# Patient Record
Sex: Female | Born: 1965
Health system: Southern US, Community
[De-identification: ages and names within clinical notes are randomized; demographics above are authoritative.]

## PROBLEM LIST (undated history)

## (undated) HISTORY — PX: DILATION AND CURETTAGE OF UTERUS: SHX78

---
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
## 2013-12-18 ENCOUNTER — Encounter: Payer: Self-pay | Admitting: Women's Health

## 2013-12-18 ENCOUNTER — Other Ambulatory Visit (HOSPITAL_COMMUNITY)
Admission: RE | Admit: 2013-12-18 | Discharge: 2013-12-18 | Disposition: A | Discharge status: Home / Self Care | Payer: BC Managed Care – PPO | Source: Ambulatory Visit | Attending: Gynecology | Admitting: Gynecology

## 2013-12-18 ENCOUNTER — Ambulatory Visit (INDEPENDENT_AMBULATORY_CARE_PROVIDER_SITE_OTHER): Payer: BC Managed Care – PPO | Admitting: Women's Health

## 2013-12-18 VITALS — BP 110/68 | Ht 62.0 in | Wt 139.2 lb

## 2013-12-18 DIAGNOSIS — N951 Menopausal and female climacteric states: Secondary | ICD-10-CM

## 2013-12-18 DIAGNOSIS — E079 Disorder of thyroid, unspecified: Secondary | ICD-10-CM

## 2013-12-18 DIAGNOSIS — Z01419 Encounter for gynecological examination (general) (routine) without abnormal findings: Secondary | ICD-10-CM | POA: Insufficient documentation

## 2013-12-18 DIAGNOSIS — Z13220 Encounter for screening for lipoid disorders: Secondary | ICD-10-CM

## 2013-12-18 LAB — COMPREHENSIVE METABOLIC PANEL
ALT: 23 U/L (ref 0–35)
AST: 18 U/L (ref 0–37)
Albumin: 4.4 g/dL (ref 3.5–5.2)
Alkaline Phosphatase: 52 U/L (ref 39–117)
BUN: 11 mg/dL (ref 6–23)
CALCIUM: 9.6 mg/dL (ref 8.4–10.5)
CHLORIDE: 101 meq/L (ref 96–112)
CO2: 31 meq/L (ref 19–32)
Creat: 0.56 mg/dL (ref 0.50–1.10)
GLUCOSE: 82 mg/dL (ref 70–99)
Potassium: 4.3 mEq/L (ref 3.5–5.3)
Sodium: 136 mEq/L (ref 135–145)
Total Bilirubin: 0.6 mg/dL (ref 0.2–1.2)
Total Protein: 6.9 g/dL (ref 6.0–8.3)

## 2013-12-18 LAB — CBC WITH DIFFERENTIAL/PLATELET
Basophils Absolute: 0 10*3/uL (ref 0.0–0.1)
Basophils Relative: 0 % (ref 0–1)
EOS PCT: 1 % (ref 0–5)
Eosinophils Absolute: 0.1 10*3/uL (ref 0.0–0.7)
HEMATOCRIT: 43.8 % (ref 36.0–46.0)
Hemoglobin: 15.1 g/dL — ABNORMAL HIGH (ref 12.0–15.0)
LYMPHS ABS: 1.9 10*3/uL (ref 0.7–4.0)
Lymphocytes Relative: 24 % (ref 12–46)
MCH: 30.6 pg (ref 26.0–34.0)
MCHC: 34.5 g/dL (ref 30.0–36.0)
MCV: 88.7 fL (ref 78.0–100.0)
MONO ABS: 0.3 10*3/uL (ref 0.1–1.0)
MONOS PCT: 4 % (ref 3–12)
Neutro Abs: 5.5 10*3/uL (ref 1.7–7.7)
Neutrophils Relative %: 71 % (ref 43–77)
Platelets: 353 10*3/uL (ref 150–400)
RBC: 4.94 MIL/uL (ref 3.87–5.11)
RDW: 13.3 % (ref 11.5–15.5)
WBC: 7.8 10*3/uL (ref 4.0–10.5)

## 2013-12-18 LAB — LIPID PANEL
CHOL/HDL RATIO: 2.8 ratio
CHOLESTEROL: 175 mg/dL (ref 0–200)
HDL: 63 mg/dL (ref >39)
LDL Cholesterol: 98 mg/dL (ref 0–99)
Triglycerides: 69 mg/dL (ref <150)
VLDL: 14 mg/dL (ref 0–40)

## 2013-12-18 LAB — TSH: TSH: 1.326 u[IU]/mL (ref 0.350–4.500)

## 2013-12-18 MED ORDER — ESTRADIOL 0.05 MG/24HR TD PTTW: 1.0000 | MEDICATED_PATCH | TRANSDERMAL | Status: DC

## 2013-12-18 NOTE — Progress Notes (Signed)
Susan Hensley March 26, 1966 [id number]    History:    Presents for annual exam with complaints of hot flushes, mood swings, and stress and urge incontinence wearing a pad daily. Mirena IUD placed May 2013 , irregular light bleeding. History of irregular cycles and endometrial hyperplasia with D&C March 2013, Mirena placed after. Last pap normal April 2014. Last 3D mammogram normal December 2013.   Past medical history, past surgical history, family history and social history were all reviewed and documented in the EPIC chart. Recent move from [place]. Works from home. Married with  twins, boy and girl, 48 years old. Husband is retired IT sales professionalfirefighter.   ROS:  A  ROS was performed and pertinent positives and negatives are included.  Exam:  Filed Vitals:   12/18/13 1111  BP: 110/68    General appearance:  Normal, appears well Thyroid:  Symmetrical, normal in size, without palpable masses or nodularity. Respiratory  Auscultation:  Clear without wheezing or rhonchi Cardiovascular  Auscultation:  Regular rate, without rubs, murmurs or gallops  Edema/varicosities:  Not grossly evident Abdominal  Soft,nontender, without masses, guarding or rebound.  Liver/spleen:  No organomegaly noted  Hernia:  None appreciated  Skin  Inspection:  Grossly normal   Breasts: Examined lying and sitting.     Right: Without masses, retractions, discharge or axillary adenopathy.     Left: Without masses, retractions, discharge or axillary adenopathy. Gentitourinary   Inguinal/mons:  Normal without inguinal adenopathy  External genitalia:  Normal  BUS/Urethra/Skene's glands:  Normal  Vagina:  Normal  Cervix:  Normal  Uterus:  normal in size, shape and contour.  Midline and mobile  Adnexa/parametria:     Rt: Without masses or tenderness.   Lt: Without masses or tenderness.  Anus and perineum: Normal  Digital rectal exam: Normal sphincter tone without palpated masses or tenderness  Assessment/Plan:   48 y.o. MWF G1P2 for annual exam with complaint of incontinence and menopausal symptoms.   Menopausal symptoms  Incontinence Mirena IUD May 2013 with history of endometrial hyperplasia  Plan: Options menopause reviewed, Vivelle 0.05 Estrogen patch twice/week, use and risk of blood clot/stroke and breast cancer discussed. Will schedule Follow up appointment over summer to evaluate incontinence with urologist. Reviewed importance of continuing regular exercise, SBE's, calcium rich diet, vitamin D 2000 daily encouraged. UA, TSH, Lyme antibodies per request, Lipid panel, FSH, PAP with HR HPV typing, CMP, CBC pending.   Harrington Challengerancy J Young [facility]NP, 12:27 PM 12/18/2013

## 2013-12-18 NOTE — Patient Instructions (Signed)
Health Recommendations for Postmenopausal Women Respected and ongoing research has looked at the most common causes of death, disability, and poor quality of life in postmenopausal women. The causes include heart disease, diseases of blood vessels, diabetes, depression, cancer, and bone loss (osteoporosis). Many things can be done to help lower the chances of developing these and other common problems: CARDIOVASCULAR DISEASE Heart Disease: A heart attack is a medical emergency. Know the signs and symptoms of a heart attack. Below are things women can do to reduce their risk for heart disease.   Do not smoke. If you smoke, quit.  Aim for a healthy weight. Being overweight causes many preventable deaths. Eat a healthy and balanced diet and drink an adequate amount of liquids.  Get moving. Make a commitment to be more physically active. Aim for 30 minutes of activity on most, if not all days of the week.  Eat for heart health. Choose a diet that is low in saturated fat and cholesterol and eliminate trans fat. Include whole grains, vegetables, and fruits. Read and understand the labels on food containers before buying.  Know your numbers. Ask your caregiver to check your blood pressure, cholesterol (total, HDL, LDL, triglycerides) and blood glucose. Work with your caregiver on improving your entire clinical picture.  High blood pressure. Limit or stop your table salt intake (try salt substitute and food seasonings). Avoid salty foods and drinks. Read labels on food containers before buying. Eating well and exercising can help control high blood pressure. STROKE  Stroke is a medical emergency. Stroke may be the result of a blood clot in a blood vessel in the brain or by a brain hemorrhage (bleeding). Know the signs and symptoms of a stroke. To lower the risk of developing a stroke:  Avoid fatty foods.  Quit smoking.  Control your diabetes, blood pressure, and irregular heart rate. THROMBOPHLEBITIS  (BLOOD CLOT) OF THE LEG  Becoming overweight and leading a stationary lifestyle may also contribute to developing blood clots. Controlling your diet and exercising will help lower the risk of developing blood clots. CANCER SCREENING  Breast Cancer: Take steps to reduce your risk of breast cancer.  You should practice "breast self-awareness." This means understanding the normal appearance and feel of your breasts and should include breast self-examination. Any changes detected, no matter how small, should be reported to your caregiver.  After age 40, you should have a clinical breast exam (CBE) every year.  Starting at age 40, you should consider having a mammogram (breast X-ray) every year.  If you have a family history of breast cancer, talk to your caregiver about genetic screening.  If you are at high risk for breast cancer, talk to your caregiver about having an MRI and a mammogram every year.  Intestinal or Stomach Cancer: Tests to consider are a rectal exam, fecal occult blood, sigmoidoscopy, and colonoscopy. Women who are high risk may need to be screened at an earlier age and more often.  Cervical Cancer:  Beginning at age 30, you should have a Pap test every 3 years as long as the past 3 Pap tests have been normal.  If you have had past treatment for cervical cancer or a condition that could lead to cancer, you need Pap tests and screening for cancer for at least 20 years after your treatment.  If you had a hysterectomy for a problem that was not cancer or a condition that could lead to cancer, then you no longer need Pap tests.    If you are between ages 65 and 70, and you have had normal Pap tests going back 10 years, you no longer need Pap tests.  If Pap tests have been discontinued, risk factors (such as a new sexual partner) need to be reassessed to determine if screening should be resumed.  Some medical problems can increase the chance of getting cervical cancer. In these  cases, your caregiver may recommend more frequent screening and Pap tests.  Uterine Cancer: If you have vaginal bleeding after reaching menopause, you should notify your caregiver.  Ovarian cancer: Other than yearly pelvic exams, there are no reliable tests available to screen for ovarian cancer at this time except for yearly pelvic exams.  Lung Cancer: Yearly chest X-rays can detect lung cancer and should be done on high risk women, such as cigarette smokers and women with chronic lung disease (emphysema).  Skin Cancer: A complete body skin exam should be done at your yearly examination. Avoid overexposure to the sun and ultraviolet light lamps. Use a strong sun block cream when in the sun. All of these things are important in lowering the risk of skin cancer. MENOPAUSE Menopause Symptoms: Hormone therapy products are effective for treating symptoms associated with menopause:  Moderate to severe hot flashes.  Night sweats.  Mood swings.  Headaches.  Tiredness.  Loss of sex drive.  Insomnia.  Other symptoms. Hormone replacement carries certain risks, especially in older women. Women who use or are thinking about using estrogen or estrogen with progestin treatments should discuss that with their caregiver. Your caregiver will help you understand the benefits and risks. The ideal dose of hormone replacement therapy is not known. The Food and Drug Administration (FDA) has concluded that hormone therapy should be used only at the lowest doses and for the shortest amount of time to reach treatment goals.  OSTEOPOROSIS Protecting Against Bone Loss and Preventing Fracture: If you use hormone therapy for prevention of bone loss (osteoporosis), the risks for bone loss must outweigh the risk of the therapy. Ask your caregiver about other medications known to be safe and effective for preventing bone loss and fractures. To guard against bone loss or fractures, the following is recommended:  If  you are less than age 50, take 1000 mg of calcium and at least 600 mg of Vitamin D per day.  If you are greater than age 50 but less than age 70, take 1200 mg of calcium and at least 600 mg of Vitamin D per day.  If you are greater than age 70, take 1200 mg of calcium and at least 800 mg of Vitamin D per day. Smoking and excessive alcohol intake increases the risk of osteoporosis. Eat foods rich in calcium and vitamin D and do weight bearing exercises several times a week as your caregiver suggests. DIABETES Diabetes Melitus: If you have Type I or Type 2 diabetes, you should keep your blood sugar under control with diet, exercise and recommended medication. Avoid too many sweets, starchy and fatty foods. Being overweight can make control more difficult. COGNITION AND MEMORY Cognition and Memory: Menopausal hormone therapy is not recommended for the prevention of cognitive disorders such as Alzheimer's disease or memory loss.  DEPRESSION  Depression may occur at any age, but is common in elderly women. The reasons may be because of physical, medical, social (loneliness), or financial problems and needs. If you are experiencing depression because of medical problems and control of symptoms, talk to your caregiver about this. Physical activity and   exercise may help with mood and sleep. Community and volunteer involvement may help your sense of value and worth. If you have depression and you feel that the problem is getting worse or becoming severe, talk to your caregiver about treatment options that are best for you. ACCIDENTS  Accidents are common and can be serious in the elderly woman. Prepare your house to prevent accidents. Eliminate throw rugs, place hand bars in the bath, shower and toilet areas. Avoid wearing high heeled shoes or walking on wet, snowy, and icy areas. Limit or stop driving if you have vision or hearing problems, or you feel you are unsteady with you movements and  reflexes. HEPATITIS C Hepatitis C is a type of viral infection affecting the liver. It is spread mainly through contact with blood from an infected person. It can be treated, but if left untreated, it can lead to severe liver damage over years. Many people who are infected do not know that the virus is in their blood. If you are a "baby-boomer", it is recommended that you have one screening test for Hepatitis C. IMMUNIZATIONS  Several immunizations are important to consider having during your senior years, including:   Tetanus, diptheria, and pertussis booster shot.  Influenza every year before the flu season begins.  Pneumonia vaccine.  Shingles vaccine.  Others as indicated based on your specific needs. Talk to your caregiver about these. Document Released: 10/13/2005 Document Revised: 08/07/2012 Document Reviewed: 06/08/2008 Exit[employer] Patient Information 2014 [employer].  

## 2013-12-19 LAB — URINALYSIS W MICROSCOPIC + REFLEX CULTURE
BILIRUBIN URINE: NEGATIVE
Bacteria, UA: NONE SEEN
Casts: NONE SEEN
Crystals: NONE SEEN
GLUCOSE, UA: NEGATIVE mg/dL
Hgb urine dipstick: NEGATIVE
Ketones, ur: NEGATIVE mg/dL
Leukocytes, UA: NEGATIVE
Nitrite: NEGATIVE
Protein, ur: NEGATIVE mg/dL
Specific Gravity, Urine: 1.011 (ref 1.005–1.030)
Squamous Epithelial / LPF: NONE SEEN
Urobilinogen, UA: 0.2 mg/dL (ref 0.0–1.0)
pH: 7 (ref 5.0–8.0)

## 2013-12-19 LAB — LYME ABY, WSTRN BLT IGG & IGM W/BANDS
B BURGDORFERI IGG ABS (IB): NEGATIVE
B BURGDORFERI IGM ABS (IB): NEGATIVE
LYME DISEASE 30 KD IGG: NONREACTIVE
LYME DISEASE 41 KD IGG: NONREACTIVE
LYME DISEASE 45 KD IGG: NONREACTIVE
LYME DISEASE 58 KD IGG: NONREACTIVE
Lyme Disease 18 kD IgG: NONREACTIVE
Lyme Disease 23 kD IgG: NONREACTIVE
Lyme Disease 23 kD IgM: NONREACTIVE
Lyme Disease 28 kD IgG: NONREACTIVE
Lyme Disease 39 kD IgG: NONREACTIVE
Lyme Disease 39 kD IgM: NONREACTIVE
Lyme Disease 41 kD IgM: NONREACTIVE
Lyme Disease 66 kD IgG: NONREACTIVE
Lyme Disease 93 kD IgG: NONREACTIVE

## 2013-12-19 LAB — FOLLICLE STIMULATING HORMONE: FSH: 13.2 m[IU]/mL

## 2014-02-18 ENCOUNTER — Other Ambulatory Visit: Payer: Self-pay

## 2014-02-18 DIAGNOSIS — Z1231 Encounter for screening mammogram for malignant neoplasm of breast: Secondary | ICD-10-CM

## 2014-02-24 ENCOUNTER — Ambulatory Visit
Admission: RE | Admit: 2014-02-24 | Discharge: 2014-02-24 | Disposition: A | Discharge status: Home / Self Care | Payer: BC Managed Care – PPO | Source: Ambulatory Visit

## 2014-02-24 ENCOUNTER — Other Ambulatory Visit: Payer: Self-pay

## 2014-02-24 DIAGNOSIS — Z1231 Encounter for screening mammogram for malignant neoplasm of breast: Secondary | ICD-10-CM

## 2014-11-21 ENCOUNTER — Emergency Department (HOSPITAL_COMMUNITY): Payer: BLUE CROSS/BLUE SHIELD

## 2014-11-21 ENCOUNTER — Emergency Department (HOSPITAL_COMMUNITY)
Admission: EM | Admit: 2014-11-21 | Discharge: 2014-11-21 | Disposition: A | Discharge status: Home / Self Care | Payer: BLUE CROSS/BLUE SHIELD | Attending: Emergency Medicine | Admitting: Emergency Medicine

## 2014-11-21 ENCOUNTER — Encounter (HOSPITAL_COMMUNITY): Payer: Self-pay | Admitting: *Deleted

## 2014-11-21 DIAGNOSIS — Y998 Other external cause status: Secondary | ICD-10-CM | POA: Insufficient documentation

## 2014-11-21 DIAGNOSIS — S0121XA Laceration without foreign body of nose, initial encounter: Secondary | ICD-10-CM | POA: Insufficient documentation

## 2014-11-21 DIAGNOSIS — Y929 Unspecified place or not applicable: Secondary | ICD-10-CM | POA: Insufficient documentation

## 2014-11-21 DIAGNOSIS — Z79899 Other long term (current) drug therapy: Secondary | ICD-10-CM | POA: Diagnosis not present

## 2014-11-21 DIAGNOSIS — W2209XA Striking against other stationary object, initial encounter: Secondary | ICD-10-CM | POA: Diagnosis not present

## 2014-11-21 DIAGNOSIS — Y9389 Activity, other specified: Secondary | ICD-10-CM | POA: Diagnosis not present

## 2014-11-21 DIAGNOSIS — S0992XA Unspecified injury of nose, initial encounter: Secondary | ICD-10-CM | POA: Diagnosis present

## 2014-11-21 MED ORDER — IBUPROFEN 400 MG PO TABS
800.0000 mg | ORAL_TABLET | Freq: Once | ORAL | Status: AC
  Administered 2014-11-21: 800 mg via ORAL
  Filled 2014-11-21: qty 2

## 2014-11-21 MED ORDER — LIDOCAINE-EPINEPHRINE (PF) 2 %-1:200000 IJ SOLN
10.0000 mL | Freq: Once | INTRAMUSCULAR | Status: DC
  Filled 2014-11-21: qty 20

## 2014-11-21 NOTE — ED Provider Notes (Signed)
CSN: [id number]     Arrival date & time 11/21/14  1653 History  This chart was scribed for Celene Skeenobyn Purvis Sidle, PA-C working with Linwood DibblesJon Knapp, MD by Elveria Risingimelie Horne, ED Scribe. This patient was seen in room TR05C/TR05C and the patient's care was started at 5:40 PM.   Chief Complaint  Patient presents with  . Laceration   The history is provided by the patient. No language interpreter was used.   HPI Comments: Susan Hensley is a 49 y.o. female who presents to the Emergency Department with a laceration to the bridge of her nose incurred this afternoon. Patient reports hitting nose on trunk when attempting to load bags into her car due to "misjudged deep perception." Patient denies loss of consciousness. Patient currently rates her pain at 8/10.   History reviewed. No pertinent past medical history. Past Surgical History  Procedure Laterality Date  . Foot surgery Bilateral 1986  . Dilation and curettage of uterus     Family History  Problem Relation Age of Onset  . Diabetes Maternal Grandmother   . Heart failure Maternal Grandfather    History  Substance Use Topics  . Smoking status: Never Smoker   . Smokeless tobacco: Not on file  . Alcohol Use: 1.2 oz/week    2 Glasses of wine per week   OB History    Gravida Para Term Preterm AB TAB SAB Ectopic Multiple Living            2     Review of Systems  Constitutional: Negative for fever and chills.  HENT:       Facial pain  Skin: Positive for wound.  Neurological: Negative for syncope.      Allergies  Review of patient's allergies indicates no known allergies.  Home Medications   Prior to Admission medications   Medication Sig Start Date End Date Taking? Authorizing Provider  estradiol (VIVELLE-DOT) 0.05 MG/24HR patch Place 1 patch (0.05 mg total) onto the skin 2 (two) times a week. 12/18/13   Harrington ChallengerNancy J Young, NP   Triage Vitals: BP 118/76 mmHg  Pulse 72  Temp(Src) 97.9 F (36.6 C) (Oral)  Resp 18  SpO2 100% Physical Exam   Constitutional: She is oriented to person, place, and time. She appears well-developed and well-nourished. No distress.  HENT:  Head: Normocephalic.  Nose:    Mouth/Throat: Oropharynx is clear and moist.  Tenderness over nasal bridge. Mild swelling. No bruising. Nares patent. No epistaxis.  Eyes: Conjunctivae and EOM are normal.  Neck: Normal range of motion. Neck supple.  Cardiovascular: Normal rate, regular rhythm and normal heart sounds.   Pulmonary/Chest: Effort normal and breath sounds normal. No respiratory distress.  Musculoskeletal: Normal range of motion. She exhibits no edema.  Neurological: She is alert and oriented to person, place, and time. No sensory deficit.  Skin: Skin is warm and dry.  Psychiatric: She has a normal mood and affect. Her behavior is normal.  Nursing note and vitals reviewed.   ED Course  Procedures (including critical care time)  LACERATION REPAIR Performed by: Celene Skeenobyn Treyshawn Muldrew, PA-c Consent: Verbal consent obtained. Risks and benefits: risks, benefits and alternatives were discussed Patient identity confirmed: provided demographic data Time out performed prior to procedure Prepped and Draped in normal sterile fashion Wound explored Laceration Location: nose Laceration Length: 1 cm No Foreign Bodies seen or palpated Anesthesia: local infiltration Local anesthetic: lidocaine 2% with epinephrine Anesthetic total: 1 ml Irrigation method: syringe Amount of cleaning: standard Skin closure: 6-0 prolene Number of  sutures or staples: 3 Technique: simple interrupted Patient tolerance: Patient tolerated the procedure well with no immediate complications.   COORDINATION OF CARE: 5:47 PM- Plans to repair laceration. Discussed treatment plan with patient at bedside and patient agreed to plan.   Labs Review Labs Reviewed - No data to display  Imaging Review Dg Nasal Bones  11/21/2014   CLINICAL DATA:  L side of nose has laceration; pt closed trunk on  nose X 1 day  EXAM: NASAL BONES - 3+ VIEW  COMPARISON:  None.  FINDINGS: No fracture. No bony abnormality. Subtle laceration is noted over the bridge of the nose. No radiopaque foreign body. Sinuses are clear.  IMPRESSION: No fracture   Electronically Signed   By: Amie Portland M.D.   On: 11/21/2014 18:28     EKG Interpretation None      MDM   Final diagnoses:  Laceration of nose, initial encounter   NAD. No epistaxis. Laceration repaired. No associated nasal bone fracture. Stable for discharge. Follow-up with PCP. Return precautions given. Patient states understanding of treatment care plan and is agreeable.  I personally performed the services described in this documentation, which was scribed in my presence. The recorded information has been reviewed and is accurate.    Kathrynn Speed, PA-C 11/21/14 1854  Linwood Dibbles, MD 11/22/14 [phone]

## 2014-11-21 NOTE — ED Notes (Signed)
The pt is c/o a laceration to the bridge of her nose.  She wass struck by the trunk lid while getting bags from the  Trunk. No loc.  Bleeding controlled.

## 2014-11-21 NOTE — Discharge Instructions (Signed)
Apply ice to nose. Take ibuprofen or Tylenol for pain. Follow-up with your primary care physician or an urgent care center in 5-7 days for suture removal.  Facial Laceration  A facial laceration is a cut on the face. These injuries can be painful and cause bleeding. Lacerations usually heal quickly, but they need special care to reduce scarring. DIAGNOSIS  Your health care provider will take a medical history, ask for details about how the injury occurred, and examine the wound to determine how deep the cut is. TREATMENT  Some facial lacerations may not require closure. Others may not be able to be closed because of an increased risk of infection. The risk of infection and the chance for successful closure will depend on various factors, including the amount of time since the injury occurred. The wound may be cleaned to help prevent infection. If closure is appropriate, pain medicines may be given if needed. Your health care provider will use stitches (sutures), wound glue (adhesive), or skin adhesive strips to repair the laceration. These tools bring the skin edges together to allow for faster healing and a better cosmetic outcome. If needed, you may also be given a tetanus shot. HOME CARE INSTRUCTIONS  Only take over-the-counter or prescription medicines as directed by your health care provider.  Follow your health care provider's instructions for wound care. These instructions will vary depending on the technique used for closing the wound. For Sutures:  Keep the wound clean and dry.   If you were given a bandage (dressing), you should change it at least once a day. Also change the dressing if it becomes wet or dirty, or as directed by your health care provider.   Wash the wound with soap and water 2 times a day. Rinse the wound off with water to remove all soap. Pat the wound dry with a clean towel.   After cleaning, apply a thin layer of the antibiotic ointment recommended by your  health care provider. This will help prevent infection and keep the dressing from sticking.   You may shower as usual after the first 24 hours. Do not soak the wound in water until the sutures are removed.   Get your sutures removed as directed by your health care provider. With facial lacerations, sutures should usually be taken out after 4-5 days to avoid stitch marks.   Wait a few days after your sutures are removed before applying any makeup. For Skin Adhesive Strips:  Keep the wound clean and dry.   Do not get the skin adhesive strips wet. You may bathe carefully, using caution to keep the wound dry.   If the wound gets wet, pat it dry with a clean towel.   Skin adhesive strips will fall off on their own. You may trim the strips as the wound heals. Do not remove skin adhesive strips that are still stuck to the wound. They will fall off in time.  For Wound Adhesive:  You may briefly wet your wound in the shower or bath. Do not soak or scrub the wound. Do not swim. Avoid periods of heavy sweating until the skin adhesive has fallen off on its own. After showering or bathing, gently pat the wound dry with a clean towel.   Do not apply liquid medicine, cream medicine, ointment medicine, or makeup to your wound while the skin adhesive is in place. This may loosen the film before your wound is healed.   If a dressing is placed over the wound,  be careful not to apply tape directly over the skin adhesive. This may cause the adhesive to be pulled off before the wound is healed.   Avoid prolonged exposure to sunlight or tanning lamps while the skin adhesive is in place.  The skin adhesive will usually remain in place for 5-10 days, then naturally fall off the skin. Do not pick at the adhesive film.  After Healing: Once the wound has healed, cover the wound with sunscreen during the day for 1 full year. This can help minimize scarring. Exposure to ultraviolet light in the first year  will darken the scar. It can take 1-2 years for the scar to lose its redness and to heal completely.  SEEK IMMEDIATE MEDICAL CARE IF:  You have redness, pain, or swelling around the wound.   You see ayellowish-white fluid (pus) coming from the wound.   You have chills or a fever.  MAKE SURE YOU:  Understand these instructions.  Will watch your condition.  Will get help right away if you are not doing well or get worse. Document Released: 09/28/2004 Document Revised: 06/11/2013 Document Reviewed: 04/03/2013 [employer] Patient Information 2015 [place], [place]. This information is not intended to replace advice given to you by your health care provider. Make sure you discuss any questions you have with your health care provider.

## 2014-12-03 ENCOUNTER — Emergency Department (HOSPITAL_COMMUNITY)
Admission: EM | Admit: 2014-12-03 | Discharge: 2014-12-03 | Disposition: A | Discharge status: Home / Self Care | Payer: BLUE CROSS/BLUE SHIELD | Attending: Emergency Medicine | Admitting: Emergency Medicine

## 2014-12-03 ENCOUNTER — Encounter (HOSPITAL_COMMUNITY): Payer: Self-pay | Admitting: *Deleted

## 2014-12-03 DIAGNOSIS — Z4802 Encounter for removal of sutures: Secondary | ICD-10-CM

## 2014-12-03 NOTE — Discharge Instructions (Signed)
Wash the affected area with soap and water and apply a thin layer of topical antibiotic ointment. Do this every 12 hours.  ° °Do not use rubbing alcohol or hydrogen peroxide.                       ° °Look for signs of infection: if you see redness, if the area becomes warm, if pain increases sharply, there is discharge (pus), if red streaks appear or you develop fever or vomiting, RETURN immediately to the Emergency Department  for a recheck.  ° ° °Suture Removal, Care After °Refer to this sheet in the next few weeks. These instructions provide you with information on caring for yourself after your procedure. Your health care provider may also give you more specific instructions. Your treatment has been planned according to current medical practices, but problems sometimes occur. Call your health care provider if you have any problems or questions after your procedure. °WHAT TO EXPECT AFTER THE PROCEDURE °After your stitches (sutures) are removed, it is typical to have the following: °· Some discomfort and swelling in the wound area. °· Slight redness in the area. °HOME CARE INSTRUCTIONS  °· If you have skin adhesive strips over the wound area, do not take the strips off. They will fall off on their own in a few days. If the strips remain in place after 14 days, you may remove them. °· Change any bandages (dressings) at least once a day or as directed by your health care provider. If the bandage sticks, soak it off with warm, soapy water. °· Apply cream or ointment only as directed by your health care provider. If using cream or ointment, wash the area with soap and water 2 times a day to remove all the cream or ointment. Rinse off the soap and pat the area dry with a clean towel. °· Keep the wound area dry and clean. If the bandage becomes wet or dirty, or if it develops a bad smell, change it as soon as possible. °· Continue to protect the wound from injury. °· Use sunscreen when out in the sun. New scars become  sunburned easily. °SEEK MEDICAL CARE IF: °· You have increasing redness, swelling, or pain in the wound. °· You see pus coming from the wound. °· You have a fever. °· You notice a bad smell coming from the wound or dressing. °· Your wound breaks open (edges not staying together). °Document Released: 05/16/2001 Document Revised: 06/11/2013 Document Reviewed: 04/02/2013 °[employer]® Patient Information ©2015 [employer]. This information is not intended to replace advice given to you by your health care provider. Make sure you discuss any questions you have with your health care provider. ° °

## 2014-12-03 NOTE — ED Notes (Signed)
Pt needs sutures removed 

## 2014-12-03 NOTE — ED Provider Notes (Signed)
CSN: [id number]     Arrival date & time 12/03/14  1649 History  This chart was scribed for non-physician practitioner Wynetta EmeryNicole Sophronia Varney, PA-C working with Vanetta MuldersScott Zackowski, MD by Murriel HopperAlec Bankhead, ED Scribe. This patient was seen in room TR07C/TR07C and the patient's care was started at 5:14 PM.    Chief Complaint  Patient presents with  . Suture / Staple Removal     The history is provided by the patient. No language interpreter was used.     HPI Comments: Susan Shankslice Meegan-Busk is a 49 y.o. female who presents to the Emergency Department for a suture removal. Pt was seen in ED on 11/21/14 and received three sutures over the nasal bridge. Patient has been washing around the area but not directly on the sutures. She denies fever, chills, redness, discharge, pain.   History reviewed. No pertinent past medical history. Past Surgical History  Procedure Laterality Date  . Foot surgery Bilateral 1986  . Dilation and curettage of uterus     Family History  Problem Relation Age of Onset  . Diabetes Maternal Grandmother   . Heart failure Maternal Grandfather    History  Substance Use Topics  . Smoking status: Never Smoker   . Smokeless tobacco: Not on file  . Alcohol Use: 1.2 oz/week    2 Glasses of wine per week   OB History    Gravida Para Term Preterm AB TAB SAB Ectopic Multiple Living            2     Review of Systems  10 systems reviewed and found to be negative, except as noted in the HPI.   Allergies  Review of patient's allergies indicates no known allergies.  Home Medications   Prior to Admission medications   Medication Sig Start Date End Date Taking? Authorizing Provider  estradiol (VIVELLE-DOT) 0.05 MG/24HR patch Place 1 patch (0.05 mg total) onto the skin 2 (two) times a week. Patient taking differently: Place 1 patch onto the skin 2 (two) times a week. Take on Thursday and Sundays 12/18/13  Yes Harrington ChallengerNancy J Young, NP   BP 104/68 mmHg  Pulse 74  Temp(Src) 98.5 F (36.9  C) (Oral)  Resp 20  SpO2 96% Physical Exam  Constitutional: She is oriented to person, place, and time. She appears well-developed and well-nourished. No distress.  HENT:  Head: Normocephalic.  3 sutures to nasal bridge, wound is clean dry and intact, no discharge, swelling, tenderness to palpation.  Eyes: Conjunctivae and EOM are normal. Pupils are equal, round, and reactive to light.  Cardiovascular: Normal rate.   Pulmonary/Chest: Effort normal. No stridor.  Musculoskeletal: Normal range of motion.  Neurological: She is alert and oriented to person, place, and time.  Psychiatric: She has a normal mood and affect.  Nursing note and vitals reviewed.   ED Course  Procedures (including critical care time)  DIAGNOSTIC STUDIES: Oxygen Saturation is 96% on RA, normal by my interpretation.    COORDINATION OF CARE: 5:16 PM Discussed treatment plan with pt at bedside and pt agreed to plan.   Labs Review Labs Reviewed - No data to display  Imaging Review No results found.   EKG Interpretation None      MDM   Final diagnoses:  Visit for suture removal    Filed Vitals:   12/03/14 1654  BP: 104/68  Pulse: 74  Temp: 98.5 F (36.9 C)  TempSrc: Oral  Resp: 20  SpO2: 96%    Susan Hensley is a pleasant  49 y.o. female presenting for suture removal. 3 sutures removed without complication, no signs of  infection.  Evaluation does not show pathology that would require ongoing emergent intervention or inpatient treatment. Pt is hemodynamically stable and mentating appropriately. Discussed findings and plan with patient/guardian, who agrees with care plan. All questions answered. Return precautions discussed and outpatient follow up given.    I personally performed the services described in this documentation, which was scribed in my presence. The recorded information has been reviewed and is accurate.    Joni Reining Nnamdi Dacus, PA-C 12/04/14 0144  Vanetta Mulders,  MD 12/08/14 [phone]

## 2015-01-07 ENCOUNTER — Other Ambulatory Visit: Payer: Self-pay | Admitting: *Deleted

## 2015-01-07 DIAGNOSIS — N951 Menopausal and female climacteric states: Secondary | ICD-10-CM

## 2015-01-07 MED ORDER — ESTRADIOL 0.05 MG/24HR TD PTTW: 1.0000 | MEDICATED_PATCH | TRANSDERMAL | Status: DC

## 2015-01-07 NOTE — Telephone Encounter (Signed)
ANNUAL SCHEDULED ON 02/11/15

## 2015-02-11 ENCOUNTER — Ambulatory Visit (INDEPENDENT_AMBULATORY_CARE_PROVIDER_SITE_OTHER): Payer: BLUE CROSS/BLUE SHIELD | Admitting: Women's Health

## 2015-02-11 ENCOUNTER — Encounter: Payer: Self-pay | Admitting: Women's Health

## 2015-02-11 ENCOUNTER — Other Ambulatory Visit (HOSPITAL_COMMUNITY)
Admission: RE | Admit: 2015-02-11 | Discharge: 2015-02-11 | Disposition: A | Discharge status: Home / Self Care | Payer: BLUE CROSS/BLUE SHIELD | Source: Ambulatory Visit | Attending: Gynecology | Admitting: Gynecology

## 2015-02-11 VITALS — BP 118/80 | Ht 62.0 in | Wt 132.0 lb

## 2015-02-11 DIAGNOSIS — Z01419 Encounter for gynecological examination (general) (routine) without abnormal findings: Secondary | ICD-10-CM | POA: Insufficient documentation

## 2015-02-11 DIAGNOSIS — Z975 Presence of (intrauterine) contraceptive device: Secondary | ICD-10-CM | POA: Diagnosis not present

## 2015-02-11 DIAGNOSIS — N951 Menopausal and female climacteric states: Secondary | ICD-10-CM

## 2015-02-11 LAB — COMPREHENSIVE METABOLIC PANEL
ALT: 21 U/L (ref 0–35)
AST: 15 U/L (ref 0–37)
Albumin: 4.2 g/dL (ref 3.5–5.2)
Alkaline Phosphatase: 53 U/L (ref 39–117)
BILIRUBIN TOTAL: 0.5 mg/dL (ref 0.2–1.2)
BUN: 14 mg/dL (ref 6–23)
CALCIUM: 10.1 mg/dL (ref 8.4–10.5)
CO2: 28 mEq/L (ref 19–32)
Chloride: 99 mEq/L (ref 96–112)
Creat: 0.66 mg/dL (ref 0.50–1.10)
GLUCOSE: 82 mg/dL (ref 70–99)
Potassium: 4.9 mEq/L (ref 3.5–5.3)
Sodium: 135 mEq/L (ref 135–145)
TOTAL PROTEIN: 6.8 g/dL (ref 6.0–8.3)

## 2015-02-11 LAB — CBC WITH DIFFERENTIAL/PLATELET
BASOS ABS: 0 10*3/uL (ref 0.0–0.1)
BASOS PCT: 0 % (ref 0–1)
EOS ABS: 0.1 10*3/uL (ref 0.0–0.7)
EOS PCT: 1 % (ref 0–5)
HEMATOCRIT: 45.3 % (ref 36.0–46.0)
Hemoglobin: 15.3 g/dL — ABNORMAL HIGH (ref 12.0–15.0)
LYMPHS ABS: 1.6 10*3/uL (ref 0.7–4.0)
LYMPHS PCT: 21 % (ref 12–46)
MCH: 29.7 pg (ref 26.0–34.0)
MCHC: 33.8 g/dL (ref 30.0–36.0)
MCV: 88 fL (ref 78.0–100.0)
MONO ABS: 0.6 10*3/uL (ref 0.1–1.0)
MPV: 9 fL (ref 8.6–12.4)
Monocytes Relative: 8 % (ref 3–12)
NEUTROS PCT: 70 % (ref 43–77)
Neutro Abs: 5.3 10*3/uL (ref 1.7–7.7)
Platelets: 395 10*3/uL (ref 150–400)
RBC: 5.15 MIL/uL — ABNORMAL HIGH (ref 3.87–5.11)
RDW: 13.4 % (ref 11.5–15.5)
WBC: 7.6 10*3/uL (ref 4.0–10.5)

## 2015-02-11 LAB — LIPID PANEL
Cholesterol: 174 mg/dL (ref 0–200)
HDL: 59 mg/dL (ref >=46)
LDL Cholesterol: 102 mg/dL — ABNORMAL HIGH (ref 0–99)
TRIGLYCERIDES: 67 mg/dL (ref <150)
Total CHOL/HDL Ratio: 2.9 Ratio
VLDL: 13 mg/dL (ref 0–40)

## 2015-02-11 LAB — TSH: TSH: 2.011 u[IU]/mL (ref 0.350–4.500)

## 2015-02-11 MED ORDER — ESTRADIOL 0.05 MG/24HR TD PTTW: 1.0000 | MEDICATED_PATCH | TRANSDERMAL | Status: DC

## 2015-02-11 NOTE — Patient Instructions (Signed)
Health Maintenance Adopting a healthy lifestyle and getting preventive care can go a long way to promote health and wellness. Talk with your health care provider about what schedule of regular examinations is right for you. This is a good chance for you to check in with your provider about disease prevention and staying healthy. In between checkups, there are plenty of things you can do on your own. Experts have done a lot of research about which lifestyle changes and preventive measures are most likely to keep you healthy. Ask your health care provider for more information. WEIGHT AND DIET  Eat a healthy diet  Be sure to include plenty of vegetables, fruits, low-fat dairy products, and lean protein.  Do not eat a lot of foods high in solid fats, added sugars, or salt.  Get regular exercise. This is one of the most important things you can do for your health.  Most adults should exercise for at least 150 minutes each week. The exercise should increase your heart rate and make you sweat (moderate-intensity exercise).  Most adults should also do strengthening exercises at least twice a week. This is in addition to the moderate-intensity exercise.  Maintain a healthy weight  Body mass index (BMI) is a measurement that can be used to identify possible weight problems. It estimates body fat based on height and weight. Your health care provider can help determine your BMI and help you achieve or maintain a healthy weight.  For females 25 years of age and older:   A BMI below 18.5 is considered underweight.  A BMI of 18.5 to 24.9 is normal.  A BMI of 25 to 29.9 is considered overweight.  A BMI of 30 and above is considered obese.  Watch levels of cholesterol and blood lipids  You should start having your blood tested for lipids and cholesterol at 49 years of age, then have this test every 5 years.  You may need to have your cholesterol levels checked more often if:  Your lipid or  cholesterol levels are high.  You are older than 49 years of age.  You are at high risk for heart disease.  CANCER SCREENING   Lung Cancer  Lung cancer screening is recommended for adults 97-92 years old who are at high risk for lung cancer because of a history of smoking.  A yearly low-dose CT scan of the lungs is recommended for people who:  Currently smoke.  Have quit within the past 15 years.  Have at least a 30-pack-year history of smoking. A pack year is smoking an average of one pack of cigarettes a day for 1 year.  Yearly screening should continue until it has been 15 years since you quit.  Yearly screening should stop if you develop a health problem that would prevent you from having lung cancer treatment.  Breast Cancer  Practice breast self-awareness. This means understanding how your breasts normally appear and feel.  It also means doing regular breast self-exams. Let your health care provider know about any changes, no matter how small.  If you are in your 20s or 30s, you should have a clinical breast exam (CBE) by a health care provider every 1-3 years as part of a regular health exam.  If you are 76 or older, have a CBE every year. Also consider having a breast X-ray (mammogram) every year.  If you have a family history of breast cancer, talk to your health care provider about genetic screening.  If you are  at high risk for breast cancer, talk to your health care provider about having an MRI and a mammogram every year.  Breast cancer gene (BRCA) assessment is recommended for women who have family members with BRCA-related cancers. BRCA-related cancers include:  Breast.  Ovarian.  Tubal.  Peritoneal cancers.  Results of the assessment will determine the need for genetic counseling and BRCA1 and BRCA2 testing. Cervical Cancer Routine pelvic examinations to screen for cervical cancer are no longer recommended for nonpregnant women who are considered low  risk for cancer of the pelvic organs (ovaries, uterus, and vagina) and who do not have symptoms. A pelvic examination may be necessary if you have symptoms including those associated with pelvic infections. Ask your health care provider if a screening pelvic exam is right for you.   The Pap test is the screening test for cervical cancer for women who are considered at risk.  If you had a hysterectomy for a problem that was not cancer or a condition that could lead to cancer, then you no longer need Pap tests.  If you are older than 65 years, and you have had normal Pap tests for the past 10 years, you no longer need to have Pap tests.  If you have had past treatment for cervical cancer or a condition that could lead to cancer, you need Pap tests and screening for cancer for at least 20 years after your treatment.  If you no longer get a Pap test, assess your risk factors if they change (such as having a new sexual partner). This can affect whether you should start being screened again.  Some women have medical problems that increase their chance of getting cervical cancer. If this is the case for you, your health care provider may recommend more frequent screening and Pap tests.  The human papillomavirus (HPV) test is another test that may be used for cervical cancer screening. The HPV test looks for the virus that can cause cell changes in the cervix. The cells collected during the Pap test can be tested for HPV.  The HPV test can be used to screen women 30 years of age and older. Getting tested for HPV can extend the interval between normal Pap tests from three to five years.  An HPV test also should be used to screen women of any age who have unclear Pap test results.  After 49 years of age, women should have HPV testing as often as Pap tests.  Colorectal Cancer  This type of cancer can be detected and often prevented.  Routine colorectal cancer screening usually begins at 50 years of  age and continues through 49 years of age.  Your health care provider may recommend screening at an earlier age if you have risk factors for colon cancer.  Your health care provider may also recommend using home test kits to check for hidden blood in the stool.  A small camera at the end of a tube can be used to examine your colon directly (sigmoidoscopy or colonoscopy). This is done to check for the earliest forms of colorectal cancer.  Routine screening usually begins at age 50.  Direct examination of the colon should be repeated every 5-10 years through 49 years of age. However, you may need to be screened more often if early forms of precancerous polyps or small growths are found. Skin Cancer  Check your skin from head to toe regularly.  Tell your health care provider about any new moles or changes in   moles, especially if there is a change in a mole's shape or color.  Also tell your health care provider if you have a mole that is larger than the size of a pencil eraser.  Always use sunscreen. Apply sunscreen liberally and repeatedly throughout the day.  Protect yourself by wearing long sleeves, pants, a wide-brimmed hat, and sunglasses whenever you are outside. HEART DISEASE, DIABETES, AND HIGH BLOOD PRESSURE   Have your blood pressure checked at least every 1-2 years. High blood pressure causes heart disease and increases the risk of stroke.  If you are between 75 years and 42 years old, ask your health care provider if you should take aspirin to prevent strokes.  Have regular diabetes screenings. This involves taking a blood sample to check your fasting blood sugar level.  If you are at a normal weight and have a low risk for diabetes, have this test once every three years after 49 years of age.  If you are overweight and have a high risk for diabetes, consider being tested at a younger age or more often. PREVENTING INFECTION  Hepatitis B  If you have a higher risk for  hepatitis B, you should be screened for this virus. You are considered at high risk for hepatitis B if:  You were born in a country where hepatitis B is common. Ask your health care provider which countries are considered high risk.  Your parents were born in a high-risk country, and you have not been immunized against hepatitis B (hepatitis B vaccine).  You have HIV or AIDS.  You use needles to inject street drugs.  You live with someone who has hepatitis B.  You have had sex with someone who has hepatitis B.  You get hemodialysis treatment.  You take certain medicines for conditions, including cancer, organ transplantation, and autoimmune conditions. Hepatitis C  Blood testing is recommended for:  Everyone born from [birth year] through 1965.  Anyone with known risk factors for hepatitis C. Sexually transmitted infections (STIs)  You should be screened for sexually transmitted infections (STIs) including gonorrhea and chlamydia if:  You are sexually active and are younger than 48 years of age.  You are older than 49 years of age and your health care provider tells you that you are at risk for this type of infection.  Your sexual activity has changed since you were last screened and you are at an increased risk for chlamydia or gonorrhea. Ask your health care provider if you are at risk.  If you do not have HIV, but are at risk, it may be recommended that you take a prescription medicine daily to prevent HIV infection. This is called pre-exposure prophylaxis (PrEP). You are considered at risk if:  You are sexually active and do not regularly use condoms or know the HIV status of your partner(s).  You take drugs by injection.  You are sexually active with a partner who has HIV. Talk with your health care provider about whether you are at high risk of being infected with HIV. If you choose to begin PrEP, you should first be tested for HIV. You should then be tested every 3 months for  as long as you are taking PrEP.  PREGNANCY   If you are premenopausal and you may become pregnant, ask your health care provider about preconception counseling.  If you may become pregnant, take 400 to 800 micrograms (mcg) of folic acid every day.  If you want to prevent pregnancy, talk to your  health care provider about birth control (contraception). OSTEOPOROSIS AND MENOPAUSE   Osteoporosis is a disease in which the bones lose minerals and strength with aging. This can result in serious bone fractures. Your risk for osteoporosis can be identified using a bone density scan.  If you are 65 years of age or older, or if you are at risk for osteoporosis and fractures, ask your health care provider if you should be screened.  Ask your health care provider whether you should take a calcium or vitamin D supplement to lower your risk for osteoporosis.  Menopause may have certain physical symptoms and risks.  Hormone replacement therapy may reduce some of these symptoms and risks. Talk to your health care provider about whether hormone replacement therapy is right for you.  HOME CARE INSTRUCTIONS   Schedule regular health, dental, and eye exams.  Stay current with your immunizations.   Do not use any tobacco products including cigarettes, chewing tobacco, or electronic cigarettes.  If you are pregnant, do not drink alcohol.  If you are breastfeeding, limit how much and how often you drink alcohol.  Limit alcohol intake to no more than 1 drink per day for nonpregnant women. One drink equals 12 ounces of beer, 5 ounces of wine, or 1 ounces of hard liquor.  Do not use street drugs.  Do not share needles.  Ask your health care provider for help if you need support or information about quitting drugs.  Tell your health care provider if you often feel depressed.  Tell your health care provider if you have ever been abused or do not feel safe at home. Document Released: 03/06/2011  Document Revised: 01/05/2014 Document Reviewed: 07/23/2013 Exit[employer] Patient Information 2015 [employer]. This information is not intended to replace advice given to you by your health care provider. Make sure you discuss any questions you have with your health care provider. Exercise to Stay Healthy Exercise helps you become and stay healthy. EXERCISE IDEAS AND TIPS Choose exercises that:  You enjoy.  Fit into your day. You do not need to exercise really hard to be healthy. You can do exercises at a slow or medium level and stay healthy. You can:  Stretch before and after working out.  Try yoga, Pilates, or tai chi.  Lift weights.  Walk fast, swim, jog, run, climb stairs, bicycle, dance, or rollerskate.  Take aerobic classes. Exercises that burn about 150 calories:  Running 1  miles in 15 minutes.  Playing volleyball for 45 to 60 minutes.  Washing and waxing a car for 45 to 60 minutes.  Playing touch football for 45 minutes.  Walking 1  miles in 35 minutes.  Pushing a stroller 1  miles in 30 minutes.  Playing basketball for 30 minutes.  Raking leaves for 30 minutes.  Bicycling 5 miles in 30 minutes.  Walking 2 miles in 30 minutes.  Dancing for 30 minutes.  Shoveling snow for 15 minutes.  Swimming laps for 20 minutes.  Walking up stairs for 15 minutes.  Bicycling 4 miles in 15 minutes.  Gardening for 30 to 45 minutes.  Jumping rope for 15 minutes.  Washing windows or floors for 45 to 60 minutes. Document Released: 09/23/2010 Document Revised: 11/13/2011 Document Reviewed: 09/23/2010 Exit[employer] Patient Information 2015 [employer]. This information is not intended to replace advice given to you by your health care provider. Make sure you discuss any questions you have with your health care provider.  

## 2015-02-11 NOTE — Addendum Note (Signed)
Addended by: Kem Parkinson on: 02/11/2015 09:16 AM   Modules accepted: Orders

## 2015-02-11 NOTE — Progress Notes (Signed)
Arrianna Yorke Feb 12, 1966 [id number]    History:    Presents for annual exam.  5/13 Mirena IUD with rare bleeding. 11/2011 had a D&C for hyperplasia in [place]. Normal Pap and mammogram history.  Vivelle patch twice weekly for menopausal symptoms with moderate relief.  Past medical history, past surgical history, family history and social history were all reviewed and documented in the EPIC chart. Works from home. Twins age 49 boy/girl both doing well.  ROS:  A ROS was performed and pertinent positives and negatives are included.  Exam:  Filed Vitals:   02/11/15 0830  BP: 118/80    General appearance:  Normal Thyroid:  Symmetrical, normal in size, without palpable masses or nodularity. Respiratory  Auscultation:  Clear without wheezing or rhonchi Cardiovascular  Auscultation:  Regular rate, without rubs, murmurs or gallops  Edema/varicosities:  Not grossly evident Abdominal  Soft,nontender, without masses, guarding or rebound.  Liver/spleen:  No organomegaly noted  Hernia:  None appreciated  Skin  Inspection:  Grossly normal   Breasts: Examined lying and sitting.     Right: Without masses, retractions, discharge or axillary adenopathy.     Left: Without masses, retractions, discharge or axillary adenopathy. Gentitourinary   Inguinal/mons:  Normal without inguinal adenopathy  External genitalia:  Normal  BUS/Urethra/Skene's glands:  Normal  Vagina:  Normal  Cervix:  Normal IUD strings visible  Uterus:   normal in size, shape and contour.  Midline and mobile  Adnexa/parametria:     Rt: Without masses or tenderness.   Lt: Without masses or tenderness.  Anus and perineum: Normal  Digital rectal exam: Normal sphincter tone without palpated masses or tenderness  Assessment/Plan:  49 y.o. M WF G1 P2 for annual exam with complaint of stress incontinence.  Rare bleeding Mirena IUD on Vivelle patch 11/2011 hyperplasia D&C in [place] Mirena IUD placed after  Plan:  Continue Kegal's, frequent emptying of bladder as needed. SBE's, annual screening mammogram 3-D tomography reviewed and encouraged history of dense breasts. Regular exercise, calcium rich diet, vitamin D 1000 daily encouraged. CBC, CMP, lipid panel, TSH, UA, Pap per request, Pap normal 2015. New screening guidelines reviewed. Vivelle patch 0.05 twice weekly prescription, proper use given and reviewed.  Harrington Challenger WHNP, 9:08 AM 02/11/2015

## 2015-02-12 LAB — URINALYSIS W MICROSCOPIC + REFLEX CULTURE
Bacteria, UA: NONE SEEN
Bilirubin Urine: NEGATIVE
Casts: NONE SEEN
Crystals: NONE SEEN
Glucose, UA: NEGATIVE mg/dL
Hgb urine dipstick: NEGATIVE
Ketones, ur: NEGATIVE mg/dL
Leukocytes, UA: NEGATIVE
Nitrite: NEGATIVE
PH: 7 (ref 5.0–8.0)
PROTEIN: NEGATIVE mg/dL
SPECIFIC GRAVITY, URINE: 1.013 (ref 1.005–1.030)
Squamous Epithelial / LPF: NONE SEEN
Urobilinogen, UA: 0.2 mg/dL (ref 0.0–1.0)

## 2015-02-12 LAB — VITAMIN D 25 HYDROXY (VIT D DEFICIENCY, FRACTURES): Vit D, 25-Hydroxy: 31 ng/mL (ref 30–100)

## 2015-02-12 LAB — CYTOLOGY - PAP

## 2016-02-16 ENCOUNTER — Telehealth: Payer: Self-pay | Admitting: *Deleted

## 2016-02-16 DIAGNOSIS — N951 Menopausal and female climacteric states: Secondary | ICD-10-CM

## 2016-02-16 MED ORDER — ESTRADIOL 0.05 MG/24HR TD PTTW: 1.0000 | MEDICATED_PATCH | TRANSDERMAL | Status: DC

## 2016-02-16 NOTE — Telephone Encounter (Signed)
Pt has annual scheduled on 03/15/16 needs refill on vivelle-dot patch 0.5 mg, Rx sent.

## 2016-02-21 DIAGNOSIS — L255 Unspecified contact dermatitis due to plants, except food: Secondary | ICD-10-CM | POA: Diagnosis not present

## 2016-03-15 ENCOUNTER — Ambulatory Visit (INDEPENDENT_AMBULATORY_CARE_PROVIDER_SITE_OTHER): Payer: BLUE CROSS/BLUE SHIELD | Admitting: Women's Health

## 2016-03-15 ENCOUNTER — Other Ambulatory Visit: Payer: Self-pay | Admitting: Women's Health

## 2016-03-15 ENCOUNTER — Encounter: Payer: Self-pay | Admitting: Women's Health

## 2016-03-15 VITALS — BP 122/80 | Ht 62.0 in | Wt 133.0 lb

## 2016-03-15 DIAGNOSIS — Z1329 Encounter for screening for other suspected endocrine disorder: Secondary | ICD-10-CM

## 2016-03-15 DIAGNOSIS — N951 Menopausal and female climacteric states: Secondary | ICD-10-CM

## 2016-03-15 DIAGNOSIS — Z01419 Encounter for gynecological examination (general) (routine) without abnormal findings: Secondary | ICD-10-CM

## 2016-03-15 LAB — COMPREHENSIVE METABOLIC PANEL
ALBUMIN: 4.2 g/dL (ref 3.6–5.1)
ALT: 29 U/L (ref 6–29)
AST: 19 U/L (ref 10–35)
Alkaline Phosphatase: 60 U/L (ref 33–130)
BUN: 12 mg/dL (ref 7–25)
CHLORIDE: 102 mmol/L (ref 98–110)
CO2: 26 mmol/L (ref 20–31)
Calcium: 9.2 mg/dL (ref 8.6–10.4)
Creat: 0.72 mg/dL (ref 0.50–1.05)
Glucose, Bld: 75 mg/dL (ref 65–99)
Potassium: 3.9 mmol/L (ref 3.5–5.3)
SODIUM: 137 mmol/L (ref 135–146)
Total Bilirubin: 0.6 mg/dL (ref 0.2–1.2)
Total Protein: 6.3 g/dL (ref 6.1–8.1)

## 2016-03-15 LAB — CBC WITH DIFFERENTIAL/PLATELET
Basophils Absolute: 0 cells/uL (ref 0–200)
Basophils Relative: 0 %
EOS ABS: 50 {cells}/uL (ref 15–500)
EOS PCT: 1 %
HCT: 45.7 % — ABNORMAL HIGH (ref 35.0–45.0)
HEMOGLOBIN: 15.3 g/dL (ref 11.7–15.5)
LYMPHS ABS: 1250 {cells}/uL (ref 850–3900)
Lymphocytes Relative: 25 %
MCH: 29.8 pg (ref 27.0–33.0)
MCHC: 33.5 g/dL (ref 32.0–36.0)
MCV: 89.1 fL (ref 80.0–100.0)
MPV: 9.4 fL (ref 7.5–12.5)
Monocytes Absolute: 300 cells/uL (ref 200–950)
Monocytes Relative: 6 %
NEUTROS ABS: 3400 {cells}/uL (ref 1500–7800)
Neutrophils Relative %: 68 %
Platelets: 317 10*3/uL (ref 140–400)
RBC: 5.13 MIL/uL — ABNORMAL HIGH (ref 3.80–5.10)
RDW: 13.1 % (ref 11.0–15.0)
WBC: 5 10*3/uL (ref 3.8–10.8)

## 2016-03-15 MED ORDER — ESTRADIOL 0.05 MG/24HR TD PTTW: 1.0000 | MEDICATED_PATCH | TRANSDERMAL | Status: DC

## 2016-03-15 NOTE — Patient Instructions (Addendum)
Screening colonoscopy-[facility] [phone]  (across the street from [facility])  [phone]   ([place] [address] [employer])  Menopause is a normal process in which your reproductive ability comes to an end. This process happens gradually over a span of months to years, usually between the ages of 19 and 52. Menopause is complete when you have missed 12 consecutive menstrual periods. It is important to talk with your health care provider about some of the most common conditions that affect postmenopausal women, such as heart disease, cancer, and bone loss (osteoporosis). Adopting a healthy lifestyle and getting preventive care can help to promote your health and wellness. Those actions can also lower your chances of developing some of these common conditions. WHAT SHOULD I KNOW ABOUT MENOPAUSE? During menopause, you may experience a number of symptoms, such as:  Moderate-to-severe hot flashes.  Night sweats.  Decrease in sex drive.  Mood swings.  Headaches.  Tiredness.  Irritability.  Memory problems.  Insomnia. Choosing to treat or not to treat menopausal changes is an individual decision that you make with your health care provider. WHAT SHOULD I KNOW ABOUT HORMONE REPLACEMENT THERAPY AND SUPPLEMENTS? Hormone therapy products are effective for treating symptoms that are associated with menopause, such as hot flashes and night sweats. Hormone replacement carries certain risks, especially as you become older. If you are thinking about using estrogen or estrogen with progestin treatments, discuss the benefits and risks with your health care provider. WHAT SHOULD I KNOW ABOUT HEART DISEASE AND STROKE? Heart disease, heart attack, and stroke become more likely as you age. This may be due, in part, to the hormonal changes that your body experiences during menopause. These can affect how your body processes dietary fats, triglycerides, and  cholesterol. Heart attack and stroke are both medical emergencies. There are many things that you can do to help prevent heart disease and stroke:  Have your blood pressure checked at least every 1-2 years. High blood pressure causes heart disease and increases the risk of stroke.  If you are 53-57 years old, ask your health care provider if you should take aspirin to prevent a heart attack or a stroke.  Do not use any tobacco products, including cigarettes, chewing tobacco, or electronic cigarettes. If you need help quitting, ask your health care provider.  It is important to eat a healthy diet and maintain a healthy weight.  Be sure to include plenty of vegetables, fruits, low-fat dairy products, and lean protein.  Avoid eating foods that are high in solid fats, added sugars, or salt (sodium).  Get regular exercise. This is one of the most important things that you can do for your health.  Try to exercise for at least 150 minutes each week. The type of exercise that you do should increase your heart rate and make you sweat. This is known as moderate-intensity exercise.  Try to do strengthening exercises at least twice each week. Do these in addition to the moderate-intensity exercise.  Know your numbers.Ask your health care provider to check your cholesterol and your blood glucose. Continue to have your blood tested as directed by your health care provider. WHAT SHOULD I KNOW ABOUT CANCER SCREENING? There are several types of cancer. Take the following steps to reduce your risk and to catch any cancer development as early as possible. Breast Cancer  Practice breast self-awareness.  This means understanding how your breasts normally appear and feel.  It also means doing regular breast self-exams. Let  your health care provider know about any changes, no matter how small.  If you are 55 or older, have a clinician do a breast exam (clinical breast exam or CBE) every year. Depending on  your age, family history, and medical history, it may be recommended that you also have a yearly breast X-ray (mammogram).  If you have a family history of breast cancer, talk with your health care provider about genetic screening.  If you are at high risk for breast cancer, talk with your health care provider about having an MRI and a mammogram every year.  Breast cancer (BRCA) gene test is recommended for women who have family members with BRCA-related cancers. Results of the assessment will determine the need for genetic counseling and BRCA1 and for BRCA2 testing. BRCA-related cancers include these types:  Breast. This occurs in males or females.  Ovarian.  Tubal. This may also be called fallopian tube cancer.  Cancer of the abdominal or pelvic lining (peritoneal cancer).  Prostate.  Pancreatic. Cervical, Uterine, and Ovarian Cancer Your health care provider may recommend that you be screened regularly for cancer of the pelvic organs. These include your ovaries, uterus, and vagina. This screening involves a pelvic exam, which includes checking for microscopic changes to the surface of your cervix (Pap test).  For women ages 21-65, health care providers may recommend a pelvic exam and a Pap test every three years. For women ages 12-65, they may recommend the Pap test and pelvic exam, combined with testing for human papilloma virus (HPV), every five years. Some types of HPV increase your risk of cervical cancer. Testing for HPV may also be done on women of any age who have unclear Pap test results.  Other health care providers may not recommend any screening for nonpregnant women who are considered low risk for pelvic cancer and have no symptoms. Ask your health care provider if a screening pelvic exam is right for you.  If you have had past treatment for cervical cancer or a condition that could lead to cancer, you need Pap tests and screening for cancer for at least 20 years after your  treatment. If Pap tests have been discontinued for you, your risk factors (such as having a new sexual partner) need to be reassessed to determine if you should start having screenings again. Some women have medical problems that increase the chance of getting cervical cancer. In these cases, your health care provider may recommend that you have screening and Pap tests more often.  If you have a family history of uterine cancer or ovarian cancer, talk with your health care provider about genetic screening.  If you have vaginal bleeding after reaching menopause, tell your health care provider.  There are currently no reliable tests available to screen for ovarian cancer. Lung Cancer Lung cancer screening is recommended for adults 53-68 years old who are at high risk for lung cancer because of a history of smoking. A yearly low-dose CT scan of the lungs is recommended if you:  Currently smoke.  Have a history of at least 30 pack-years of smoking and you currently smoke or have quit within the past 15 years. A pack-year is smoking an average of one pack of cigarettes per day for one year. Yearly screening should:  Continue until it has been 15 years since you quit.  Stop if you develop a health problem that would prevent you from having lung cancer treatment. Colorectal Cancer  This type of cancer can be detected  and can often be prevented.  Routine colorectal cancer screening usually begins at age 40 and continues through age 65.  If you have risk factors for colon cancer, your health care provider may recommend that you be screened at an earlier age.  If you have a family history of colorectal cancer, talk with your health care provider about genetic screening.  Your health care provider may also recommend using home test kits to check for hidden blood in your stool.  A small camera at the end of a tube can be used to examine your colon directly (sigmoidoscopy or colonoscopy). This is  done to check for the earliest forms of colorectal cancer.  Direct examination of the colon should be repeated every 5-10 years until age 82. However, if early forms of precancerous polyps or small growths are found or if you have a family history or genetic risk for colorectal cancer, you may need to be screened more often. Skin Cancer  Check your skin from head to toe regularly.  Monitor any moles. Be sure to tell your health care provider:  About any new moles or changes in moles, especially if there is a change in a mole's shape or color.  If you have a mole that is larger than the size of a pencil eraser.  If any of your family members has a history of skin cancer, especially at a young age, talk with your health care provider about genetic screening.  Always use sunscreen. Apply sunscreen liberally and repeatedly throughout the day.  Whenever you are outside, protect yourself by wearing long sleeves, pants, a wide-brimmed hat, and sunglasses. WHAT SHOULD I KNOW ABOUT OSTEOPOROSIS? Osteoporosis is a condition in which bone destruction happens more quickly than new bone creation. After menopause, you may be at an increased risk for osteoporosis. To help prevent osteoporosis or the bone fractures that can happen because of osteoporosis, the following is recommended:  If you are 1-21 years old, get at least 1,000 mg of calcium and at least 600 mg of vitamin D per day.  If you are older than age 44 but younger than age 58, get at least 1,200 mg of calcium and at least 600 mg of vitamin D per day.  If you are older than age 48, get at least 1,200 mg of calcium and at least 800 mg of vitamin D per day. Smoking and excessive alcohol intake increase the risk of osteoporosis. Eat foods that are rich in calcium and vitamin D, and do weight-bearing exercises several times each week as directed by your health care provider. WHAT SHOULD I KNOW ABOUT HOW MENOPAUSE AFFECTS [place]? Depression may occur at any age, but it is more common as you become older. Common symptoms of depression include:  Low or sad mood.  Changes in sleep patterns.  Changes in appetite or eating patterns.  Feeling an overall lack of motivation or enjoyment of activities that you previously enjoyed.  Frequent crying spells. Talk with your health care provider if you think that you are experiencing depression. WHAT SHOULD I KNOW ABOUT IMMUNIZATIONS? It is important that you get and maintain your immunizations. These include:  Tetanus, diphtheria, and pertussis (Tdap) booster vaccine.  Influenza every year before the flu season begins.  Pneumonia vaccine.  Shingles vaccine. Your health care provider may also recommend other immunizations.   This information is not intended to replace advice given to you by your health care provider. Make sure you discuss any questions  you have with your health care provider.   Document Released: 10/13/2005 Document Revised: 09/11/2014 Document Reviewed: 04/23/2014 Elsevier Interactive Patient Education [employer].

## 2016-03-15 NOTE — Progress Notes (Signed)
Flo Shankslice Dobias-Busk Feb 04, 1966 [id number]    History:    Presents for annual exam.  01/2012 Mirena IUD, Vivelle patch 0.05 for hot flushes. 11/2011 D&E for hyperplasia and menorrhagia, this was done in [place]. Normal Pap and mammogram history.  Past medical history, past surgical history, family history and social history were all reviewed and documented in the EPIC chart. Works in [employer]. Twins are 4313  both doing well. Husband has had chronic back and neck pain head and neck surgery last year and is now doing better.  ROS:  A ROS was performed and pertinent positives and negatives are included.  Exam:  Filed Vitals:   03/15/16 0819  BP: 122/80    General appearance:  Normal Thyroid:  Symmetrical, normal in size, without palpable masses or nodularity. Respiratory  Auscultation:  Clear without wheezing or rhonchi Cardiovascular  Auscultation:  Regular rate, without rubs, murmurs or gallops  Edema/varicosities:  Not grossly evident Abdominal  Soft,nontender, without masses, guarding or rebound.  Liver/spleen:  No organomegaly noted  Hernia:  None appreciated  Skin  Inspection:  Grossly normal   Breasts: Examined lying and sitting.     Right: Without masses, retractions, discharge or axillary adenopathy.     Left: Without masses, retractions, discharge or axillary adenopathy. Gentitourinary   Inguinal/mons:  Normal without inguinal adenopathy  External genitalia:  Normal  BUS/Urethra/Skene's glands:  Normal  Vagina:  Normal  Cervix:  Normal  Uterus:  normal in size, shape and contour.  Midline and mobile  Adnexa/parametria:     Rt: Without masses or tenderness.   Lt: Without masses or tenderness.  Anus and perineum: Normal  Digital rectal exam: Normal sphincter tone without palpated masses or tenderness  Assessment/Plan:  50 y.o. M WF G1 P2 for annual exam with occasional hot flushes  5/13 Mirena IUD-amenorrhea on Vivelle patch 0.05 11/2011 D&E for hyperplasia and  menorrhagia  Name: Maryclare LabradorWe'll keep Mirena IUD in until 2018, will continue Vivelle patch 0.05 twice weekly, vitamin E twice daily encouraged. SBE's, continue annual screening mammogram overdue encouraged to schedule, 3-D tomography reviewed and encouraged history of dense breasts. Screening colonoscopy [facility] information given instructed to schedule. Encouraged increase regular exercise, calcium rich diet, vitamin D 1000 daily encouraged. CBC, CMP, vitamin D, TSH, estradiol. FSH, UA, Pap normal 2016, new screening guidelines reviewed.  Harrington ChallengerYOUNG,NANCY J WHNP, 11:02 AM 03/15/2016

## 2016-03-16 LAB — URINALYSIS W MICROSCOPIC + REFLEX CULTURE
BILIRUBIN URINE: NEGATIVE
Bacteria, UA: NONE SEEN [HPF]
Casts: NONE SEEN [LPF]
Crystals: NONE SEEN [HPF]
GLUCOSE, UA: NEGATIVE
Hgb urine dipstick: NEGATIVE
Ketones, ur: NEGATIVE
LEUKOCYTES UA: NEGATIVE
Nitrite: NEGATIVE
PH: 6 (ref 5.0–8.0)
Protein, ur: NEGATIVE
RBC / HPF: NONE SEEN RBC/HPF (ref <=2)
SPECIFIC GRAVITY, URINE: 1.019 (ref 1.001–1.035)
WBC UA: NONE SEEN WBC/HPF (ref <=5)
Yeast: NONE SEEN [HPF]

## 2016-03-16 LAB — FOLLICLE STIMULATING HORMONE: FSH: 33.5 m[IU]/mL

## 2016-03-16 LAB — TSH: TSH: 1.85 m[IU]/L

## 2016-03-16 LAB — VITAMIN D 25 HYDROXY (VIT D DEFICIENCY, FRACTURES): Vit D, 25-Hydroxy: 36 ng/mL (ref 30–100)

## 2016-03-23 LAB — ESTRADIOL, FREE
ESTRADIOL: 109 pg/mL
Estradiol, Free: 2.28 pg/mL

## 2016-03-29 DIAGNOSIS — J0100 Acute maxillary sinusitis, unspecified: Secondary | ICD-10-CM | POA: Diagnosis not present

## 2016-07-18 ENCOUNTER — Other Ambulatory Visit: Payer: Self-pay | Admitting: Women's Health

## 2016-07-18 DIAGNOSIS — Z1231 Encounter for screening mammogram for malignant neoplasm of breast: Secondary | ICD-10-CM

## 2016-08-24 ENCOUNTER — Ambulatory Visit
Admission: RE | Admit: 2016-08-24 | Discharge: 2016-08-24 | Disposition: A | Discharge status: Home / Self Care | Payer: BLUE CROSS/BLUE SHIELD | Source: Ambulatory Visit | Attending: Women's Health | Admitting: Women's Health

## 2016-08-24 DIAGNOSIS — Z1231 Encounter for screening mammogram for malignant neoplasm of breast: Secondary | ICD-10-CM

## 2016-08-29 ENCOUNTER — Encounter: Payer: Self-pay | Admitting: Women's Health

## 2016-09-18 DIAGNOSIS — J111 Influenza due to unidentified influenza virus with other respiratory manifestations: Secondary | ICD-10-CM | POA: Diagnosis not present

## 2016-10-30 DIAGNOSIS — M25562 Pain in left knee: Secondary | ICD-10-CM | POA: Diagnosis not present

## 2016-10-30 DIAGNOSIS — M25462 Effusion, left knee: Secondary | ICD-10-CM | POA: Diagnosis not present

## 2017-04-05 DIAGNOSIS — H524 Presbyopia: Secondary | ICD-10-CM | POA: Diagnosis not present

## 2017-05-04 DIAGNOSIS — L2089 Other atopic dermatitis: Secondary | ICD-10-CM | POA: Diagnosis not present

## 2017-05-09 ENCOUNTER — Telehealth: Payer: Self-pay | Admitting: *Deleted

## 2017-05-09 DIAGNOSIS — N951 Menopausal and female climacteric states: Secondary | ICD-10-CM

## 2017-05-09 MED ORDER — ESTRADIOL 0.05 MG/24HR TD PTTW: 1.0000 | MEDICATED_PATCH | TRANSDERMAL | 0 refills | Status: DC

## 2017-05-09 NOTE — Telephone Encounter (Signed)
Patient has annual scheduled on 05/29/17 needs refill on vivelle dot patch,Rx sent.

## 2017-05-29 ENCOUNTER — Encounter: Payer: Self-pay | Admitting: Women's Health

## 2017-05-29 ENCOUNTER — Ambulatory Visit (INDEPENDENT_AMBULATORY_CARE_PROVIDER_SITE_OTHER): Payer: BLUE CROSS/BLUE SHIELD | Admitting: Women's Health

## 2017-05-29 VITALS — BP 110/78 | Ht 62.0 in | Wt 132.0 lb

## 2017-05-29 DIAGNOSIS — Z01419 Encounter for gynecological examination (general) (routine) without abnormal findings: Secondary | ICD-10-CM

## 2017-05-29 DIAGNOSIS — N8501 Benign endometrial hyperplasia: Secondary | ICD-10-CM | POA: Diagnosis not present

## 2017-05-29 DIAGNOSIS — N951 Menopausal and female climacteric states: Secondary | ICD-10-CM | POA: Diagnosis not present

## 2017-05-29 DIAGNOSIS — Z30432 Encounter for removal of intrauterine contraceptive device: Secondary | ICD-10-CM | POA: Diagnosis not present

## 2017-05-29 DIAGNOSIS — N912 Amenorrhea, unspecified: Secondary | ICD-10-CM

## 2017-05-29 DIAGNOSIS — Z13220 Encounter for screening for lipoid disorders: Secondary | ICD-10-CM | POA: Diagnosis not present

## 2017-05-29 MED ORDER — ESTRADIOL 0.05 MG/24HR TD PTTW: 1.0000 | MEDICATED_PATCH | TRANSDERMAL | 4 refills | Status: DC

## 2017-05-29 MED ORDER — PROGESTERONE MICRONIZED 100 MG PO CAPS: 100.0000 mg | ORAL_CAPSULE | Freq: Every day | ORAL | 4 refills | Status: DC

## 2017-05-29 NOTE — Patient Instructions (Signed)
Health Maintenance for Postmenopausal Women Menopause is a normal process in which your reproductive ability comes to an end. This process happens gradually over a span of months to years, usually between the ages of 22 and 9. Menopause is complete when you have missed 12 consecutive menstrual periods. It is important to talk with your health care provider about some of the most common conditions that affect postmenopausal women, such as heart disease, cancer, and bone loss (osteoporosis). Adopting a healthy lifestyle and getting preventive care can help to promote your health and wellness. Those actions can also lower your chances of developing some of these common conditions. What should I know about menopause? During menopause, you may experience a number of symptoms, such as:  Moderate-to-severe hot flashes.  Night sweats.  Decrease in sex drive.  Mood swings.  Headaches.  Tiredness.  Irritability.  Memory problems.  Insomnia.  Choosing to treat or not to treat menopausal changes is an individual decision that you make with your health care provider. What should I know about hormone replacement therapy and supplements? Hormone therapy products are effective for treating symptoms that are associated with menopause, such as hot flashes and night sweats. Hormone replacement carries certain risks, especially as you become older. If you are thinking about using estrogen or estrogen with progestin treatments, discuss the benefits and risks with your health care provider. What should I know about heart disease and stroke? Heart disease, heart attack, and stroke become more likely as you age. This may be due, in part, to the hormonal changes that your body experiences during menopause. These can affect how your body processes dietary fats, triglycerides, and cholesterol. Heart attack and stroke are both medical emergencies. There are many things that you can do to help prevent heart disease  and stroke:  Have your blood pressure checked at least every 1-2 years. High blood pressure causes heart disease and increases the risk of stroke.  If you are 53-22 years old, ask your health care provider if you should take aspirin to prevent a heart attack or a stroke.  Do not use any tobacco products, including cigarettes, chewing tobacco, or electronic cigarettes. If you need help quitting, ask your health care provider.  It is important to eat a healthy diet and maintain a healthy weight. ? Be sure to include plenty of vegetables, fruits, low-fat dairy products, and lean protein. ? Avoid eating foods that are high in solid fats, added sugars, or salt (sodium).  Get regular exercise. This is one of the most important things that you can do for your health. ? Try to exercise for at least 150 minutes each week. The type of exercise that you do should increase your heart rate and make you sweat. This is known as moderate-intensity exercise. ? Try to do strengthening exercises at least twice each week. Do these in addition to the moderate-intensity exercise.  Know your numbers.Ask your health care provider to check your cholesterol and your blood glucose. Continue to have your blood tested as directed by your health care provider.  What should I know about cancer screening? There are several types of cancer. Take the following steps to reduce your risk and to catch any cancer development as early as possible. Breast Cancer  Practice breast self-awareness. ? This means understanding how your breasts normally appear and feel. ? It also means doing regular breast self-exams. Let your health care provider know about any changes, no matter how small.  If you are 40  or older, have a clinician do a breast exam (clinical breast exam or CBE) every year. Depending on your age, family history, and medical history, it may be recommended that you also have a yearly breast X-ray (mammogram).  If you  have a family history of breast cancer, talk with your health care provider about genetic screening.  If you are at high risk for breast cancer, talk with your health care provider about having an MRI and a mammogram every year.  Breast cancer (BRCA) gene test is recommended for women who have family members with BRCA-related cancers. Results of the assessment will determine the need for genetic counseling and BRCA1 and for BRCA2 testing. BRCA-related cancers include these types: ? Breast. This occurs in males or females. ? Ovarian. ? Tubal. This may also be called fallopian tube cancer. ? Cancer of the abdominal or pelvic lining (peritoneal cancer). ? Prostate. ? Pancreatic.  Cervical, Uterine, and Ovarian Cancer Your health care provider may recommend that you be screened regularly for cancer of the pelvic organs. These include your ovaries, uterus, and vagina. This screening involves a pelvic exam, which includes checking for microscopic changes to the surface of your cervix (Pap test).  For women ages 21-65, health care providers may recommend a pelvic exam and a Pap test every three years. For women ages 79-65, they may recommend the Pap test and pelvic exam, combined with testing for human papilloma virus (HPV), every five years. Some types of HPV increase your risk of cervical cancer. Testing for HPV may also be done on women of any age who have unclear Pap test results.  Other health care providers may not recommend any screening for nonpregnant women who are considered low risk for pelvic cancer and have no symptoms. Ask your health care provider if a screening pelvic exam is right for you.  If you have had past treatment for cervical cancer or a condition that could lead to cancer, you need Pap tests and screening for cancer for at least 20 years after your treatment. If Pap tests have been discontinued for you, your risk factors (such as having a new sexual partner) need to be  reassessed to determine if you should start having screenings again. Some women have medical problems that increase the chance of getting cervical cancer. In these cases, your health care provider may recommend that you have screening and Pap tests more often.  If you have a family history of uterine cancer or ovarian cancer, talk with your health care provider about genetic screening.  If you have vaginal bleeding after reaching menopause, tell your health care provider.  There are currently no reliable tests available to screen for ovarian cancer.  Lung Cancer Lung cancer screening is recommended for adults 69-62 years old who are at high risk for lung cancer because of a history of smoking. A yearly low-dose CT scan of the lungs is recommended if you:  Currently smoke.  Have a history of at least 30 pack-years of smoking and you currently smoke or have quit within the past 15 years. A pack-year is smoking an average of one pack of cigarettes per day for one year.  Yearly screening should:  Continue until it has been 15 years since you quit.  Stop if you develop a health problem that would prevent you from having lung cancer treatment.  Colorectal Cancer  This type of cancer can be detected and can often be prevented.  Routine colorectal cancer screening usually begins at  age 42 and continues through age 45.  If you have risk factors for colon cancer, your health care provider may recommend that you be screened at an earlier age.  If you have a family history of colorectal cancer, talk with your health care provider about genetic screening.  Your health care provider may also recommend using home test kits to check for hidden blood in your stool.  A small camera at the end of a tube can be used to examine your colon directly (sigmoidoscopy or colonoscopy). This is done to check for the earliest forms of colorectal cancer.  Direct examination of the colon should be repeated every  5-10 years until age 71. However, if early forms of precancerous polyps or small growths are found or if you have a family history or genetic risk for colorectal cancer, you may need to be screened more often.  Skin Cancer  Check your skin from head to toe regularly.  Monitor any moles. Be sure to tell your health care provider: ? About any new moles or changes in moles, especially if there is a change in a mole's shape or color. ? If you have a mole that is larger than the size of a pencil eraser.  If any of your family members has a history of skin cancer, especially at a Lylie Blacklock age, talk with your health care provider about genetic screening.  Always use sunscreen. Apply sunscreen liberally and repeatedly throughout the day.  Whenever you are outside, protect yourself by wearing long sleeves, pants, a wide-brimmed hat, and sunglasses.  What should I know about osteoporosis? Osteoporosis is a condition in which bone destruction happens more quickly than new bone creation. After menopause, you may be at an increased risk for osteoporosis. To help prevent osteoporosis or the bone fractures that can happen because of osteoporosis, the following is recommended:  If you are 46-71 years old, get at least 1,000 mg of calcium and at least 600 mg of vitamin D per day.  If you are older than age 55 but younger than age 65, get at least 1,200 mg of calcium and at least 600 mg of vitamin D per day.  If you are older than age 54, get at least 1,200 mg of calcium and at least 800 mg of vitamin D per day.  Smoking and excessive alcohol intake increase the risk of osteoporosis. Eat foods that are rich in calcium and vitamin D, and do weight-bearing exercises several times each week as directed by your health care provider. What should I know about how menopause affects my mental health? Depression may occur at any age, but it is more common as you become older. Common symptoms of depression  include:  Low or sad mood.  Changes in sleep patterns.  Changes in appetite or eating patterns.  Feeling an overall lack of motivation or enjoyment of activities that you previously enjoyed.  Frequent crying spells.  Talk with your health care provider if you think that you are experiencing depression. What should I know about immunizations? It is important that you get and maintain your immunizations. These include:  Tetanus, diphtheria, and pertussis (Tdap) booster vaccine.  Influenza every year before the flu season begins.  Pneumonia vaccine.  Shingles vaccine.  Your health care provider may also recommend other immunizations. This information is not intended to replace advice given to you by your health care provider. Make sure you discuss any questions you have with your health care provider. Document Released: 10/13/2005  Document Revised: 03/10/2016 Document Reviewed: 05/25/2015 Elsevier Interactive Patient Education  2018 [employer] Inc[employer]  

## 2017-05-29 NOTE — Progress Notes (Signed)
Avalon Coppinger November 13, 1965 [id number]    History:    Presents for annual exam. 01/2012 Mirena IUD amenorrheic, 2017 FSH 33, Vivelle patch twice weekly with good relief of menopausal symptoms. 2013 D and E for benign endometrial hyperplasia done in [place]. Abnormal Pap many years ago, normal mammogram history. Schedule for screening colonoscopy next month.  Past medical history, past surgical history, family history and social history were all reviewed and documented in the EPIC chart. 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 Twins age 59, daughter anxiety issues questionable autism. Husband Science writer having back problems.  ROS:  A ROS was performed and pertinent positives and negatives are included.  Exam:  Vitals:   05/29/17 1408  BP: 110/78  Weight: 132 lb (59.9 kg)  Height:  (1.575 m)   Body mass index is 24.14 kg/m.   General appearance:  Normal Thyroid:  Symmetrical, normal in size, without palpable masses or nodularity. Respiratory  Auscultation:  Clear without wheezing or rhonchi Cardiovascular  Auscultation:  Regular rate, without rubs, murmurs or gallops  Edema/varicosities:  Not grossly evident Abdominal  Soft,nontender, without masses, guarding or rebound.  Liver/spleen:  No organomegaly noted  Hernia:  None appreciated  Skin  Inspection:  Grossly normal   Breasts: Examined lying and sitting.     Right: Without masses, retractions, discharge or axillary adenopathy.     Left: Without masses, retractions, discharge or axillary adenopathy. Gentitourinary   Inguinal/mons:  Normal without inguinal adenopathy  External genitalia:  Normal  BUS/Urethra/Skene's glands:  Normal  Vagina:  Normal  Cervix:  Normal Mirena IUD strings grasped with ring forcep, IUD removed intact shown to patient and discarded.  Uterus:   normal in size, shape and contour.  Midline and mobile  Adnexa/parametria:     Rt: Without masses or  tenderness.   Lt: Without masses or tenderness.  Anus and perineum: Normal  Digital rectal exam: Normal sphincter tone without palpated masses or tenderness  Assessment/Plan:  51 y.o. MWF G1 P2  for annual exam.     Mirena IUD removed History of benign endometrial hyperplasia D and E  2013 HRT  Plan: HRT reviewed risks of blood clots, strokes and breast cancer, will continue Vivelle-Dot 0.05 patch twice weekly prescription, proper use given and reviewed, Prometrium 100 mg at bedtime. Instructed to call if bleeding. FSH 33 2017. SBE's, continue annual screening mammogram, calcium rich diet, vitamin D 1000 daily, exercise encouraged.   Keep scheduled colonoscopy appointment. Lyme disease per request, has had numerous tick exposures this past summer. CBC, FSH, CMP, will check lipid fasting lipid panel next year. Pap with HR HPV typing   Harrington Challenger [facility], 3:35 PM 05/29/2017

## 2017-05-30 LAB — PAP, TP IMAGING W/ HPV RNA, RFLX HPV TYPE 16,18/45: HPV DNA High Risk: NOT DETECTED

## 2017-05-31 ENCOUNTER — Encounter: Payer: Self-pay | Admitting: Women's Health

## 2017-05-31 LAB — TEST AUTHORIZATION

## 2017-05-31 LAB — COMPREHENSIVE METABOLIC PANEL
AG Ratio: 2 (calc) (ref 1.0–2.5)
ALKALINE PHOSPHATASE (APISO): 54 U/L (ref 33–130)
ALT: 23 U/L (ref 6–29)
AST: 18 U/L (ref 10–35)
Albumin: 4 g/dL (ref 3.6–5.1)
BILIRUBIN TOTAL: 0.5 mg/dL (ref 0.2–1.2)
BUN: 12 mg/dL (ref 7–25)
CALCIUM: 9.1 mg/dL (ref 8.6–10.4)
CHLORIDE: 103 mmol/L (ref 98–110)
CO2: 27 mmol/L (ref 20–32)
Creat: 0.61 mg/dL (ref 0.50–1.05)
GLOBULIN: 2 g/dL (ref 1.9–3.7)
Glucose, Bld: 69 mg/dL (ref 65–99)
POTASSIUM: 3.8 mmol/L (ref 3.5–5.3)
Sodium: 140 mmol/L (ref 135–146)
Total Protein: 6 g/dL — ABNORMAL LOW (ref 6.1–8.1)

## 2017-05-31 LAB — CBC WITH DIFFERENTIAL/PLATELET
Basophils Absolute: 30 cells/uL (ref 0–200)
Basophils Relative: 0.4 %
Eosinophils Absolute: 84 cells/uL (ref 15–500)
Eosinophils Relative: 1.1 %
HCT: 37.4 % (ref 35.0–45.0)
Hemoglobin: 12.7 g/dL (ref 11.7–15.5)
Lymphs Abs: 1383 cells/uL (ref 850–3900)
MCH: 29.5 pg (ref 27.0–33.0)
MCHC: 34 g/dL (ref 32.0–36.0)
MCV: 87 fL (ref 80.0–100.0)
MPV: 9.9 fL (ref 7.5–12.5)
Monocytes Relative: 5 %
NEUTROS PCT: 75.3 %
Neutro Abs: 5723 cells/uL (ref 1500–7800)
Platelets: 354 10*3/uL (ref 140–400)
RBC: 4.3 10*6/uL (ref 3.80–5.10)
RDW: 12.9 % (ref 11.0–15.0)
Total Lymphocyte: 18.2 %
WBC mixed population: 380 cells/uL (ref 200–950)
WBC: 7.6 10*3/uL (ref 3.8–10.8)

## 2017-05-31 LAB — FOLLICLE STIMULATING HORMONE: FSH: 51.6 m[IU]/mL

## 2017-05-31 LAB — B. BURGDORFI ANTIBODIES: B burgdorferi Ab IgG+IgM: <0.9 index

## 2017-06-02 DIAGNOSIS — F901 Attention-deficit hyperactivity disorder, predominantly hyperactive type: Secondary | ICD-10-CM | POA: Diagnosis not present

## 2017-06-04 DIAGNOSIS — F901 Attention-deficit hyperactivity disorder, predominantly hyperactive type: Secondary | ICD-10-CM | POA: Diagnosis not present

## 2017-06-14 DIAGNOSIS — Z01818 Encounter for other preprocedural examination: Secondary | ICD-10-CM | POA: Diagnosis not present

## 2017-06-14 DIAGNOSIS — Z1211 Encounter for screening for malignant neoplasm of colon: Secondary | ICD-10-CM | POA: Diagnosis not present

## 2017-06-19 DIAGNOSIS — F901 Attention-deficit hyperactivity disorder, predominantly hyperactive type: Secondary | ICD-10-CM | POA: Diagnosis not present

## 2017-06-19 DIAGNOSIS — F330 Major depressive disorder, recurrent, mild: Secondary | ICD-10-CM | POA: Diagnosis not present

## 2017-07-02 DIAGNOSIS — Z79899 Other long term (current) drug therapy: Secondary | ICD-10-CM | POA: Diagnosis not present

## 2017-07-02 DIAGNOSIS — Z1211 Encounter for screening for malignant neoplasm of colon: Secondary | ICD-10-CM | POA: Diagnosis not present

## 2017-07-02 DIAGNOSIS — K5730 Diverticulosis of large intestine without perforation or abscess without bleeding: Secondary | ICD-10-CM | POA: Diagnosis not present

## 2017-07-02 DIAGNOSIS — K648 Other hemorrhoids: Secondary | ICD-10-CM | POA: Diagnosis not present

## 2017-07-02 DIAGNOSIS — Q438 Other specified congenital malformations of intestine: Secondary | ICD-10-CM | POA: Diagnosis not present

## 2017-07-04 DIAGNOSIS — F901 Attention-deficit hyperactivity disorder, predominantly hyperactive type: Secondary | ICD-10-CM | POA: Diagnosis not present

## 2017-07-04 DIAGNOSIS — F330 Major depressive disorder, recurrent, mild: Secondary | ICD-10-CM | POA: Diagnosis not present

## 2017-07-30 ENCOUNTER — Other Ambulatory Visit: Payer: Self-pay | Admitting: Women's Health

## 2017-07-30 DIAGNOSIS — N951 Menopausal and female climacteric states: Secondary | ICD-10-CM

## 2017-09-12 DIAGNOSIS — D225 Melanocytic nevi of trunk: Secondary | ICD-10-CM | POA: Diagnosis not present

## 2017-09-12 DIAGNOSIS — L719 Rosacea, unspecified: Secondary | ICD-10-CM | POA: Diagnosis not present

## 2017-09-12 DIAGNOSIS — L821 Other seborrheic keratosis: Secondary | ICD-10-CM | POA: Diagnosis not present

## 2017-09-12 DIAGNOSIS — L578 Other skin changes due to chronic exposure to nonionizing radiation: Secondary | ICD-10-CM | POA: Diagnosis not present

## 2017-09-21 ENCOUNTER — Telehealth: Payer: Self-pay | Admitting: *Deleted

## 2017-09-21 ENCOUNTER — Encounter (HOSPITAL_COMMUNITY): Payer: Self-pay | Admitting: Emergency Medicine

## 2017-09-21 ENCOUNTER — Ambulatory Visit (HOSPITAL_COMMUNITY)
Admission: EM | Admit: 2017-09-21 | Discharge: 2017-09-21 | Disposition: A | Discharge status: Home / Self Care | Payer: BLUE CROSS/BLUE SHIELD | Attending: Emergency Medicine | Admitting: Emergency Medicine

## 2017-09-21 ENCOUNTER — Other Ambulatory Visit: Payer: Self-pay

## 2017-09-21 DIAGNOSIS — R102 Pelvic and perineal pain: Secondary | ICD-10-CM | POA: Diagnosis not present

## 2017-09-21 DIAGNOSIS — R1032 Left lower quadrant pain: Secondary | ICD-10-CM

## 2017-09-21 LAB — POCT URINALYSIS DIP (DEVICE)
Bilirubin Urine: NEGATIVE
Glucose, UA: NEGATIVE mg/dL
HGB URINE DIPSTICK: NEGATIVE
Ketones, ur: NEGATIVE mg/dL
LEUKOCYTES UA: NEGATIVE
NITRITE: NEGATIVE
PH: 6.5 (ref 5.0–8.0)
PROTEIN: NEGATIVE mg/dL
Urobilinogen, UA: 0.2 mg/dL (ref 0.0–1.0)

## 2017-09-21 NOTE — Discharge Instructions (Signed)
Your exam and appearance today is reassuring in that I do not feel you are having emergent symptoms. I would try antiinflammatories to help with pain, heat pack might also be helpful. Titrate your miralax to increase your bowel movements. Increase your water intake. Follow up next week as needed with your primary care provider and/or obgyn if symptoms persist or no improvement. Return to be seen or go to ER if your pain becomes severe, develop fevers, nausea or vomiting or otherwise worsening.

## 2017-09-21 NOTE — ED Provider Notes (Signed)
[facility]    CSN: [id number] Arrival date & time: 09/21/17  1333     History   Chief Complaint Chief Complaint  Patient presents with  . Abdominal Pain    HPI Susan Hensley is a 52 y.o. female.   Susan Hensley presents with complaints of LLQ pain which is dull and "twinges like ovulation" which has been intermittent for a few weeks but became constant a few days ago. It has not worsened but has persisted. Without fevers. Pain is 5/10. She states she does feel like she has been constipated and dealing with "trapped gas" with only small BM today. Without nausea or vomiting. Eating and drinking. Denies vaginal or urinary symptoms. Without back pain. States she had IUD for 5 years, had it removed in September. Did have some vaginal bleeding two days ago. Is not yet menopausal. Has not taken any medications for her symptoms. Otherwise without medical history. Has had simple endometrial hyperplasia in the past and history of d & c.     ROS per HPI.       History reviewed. No pertinent past medical history.  Patient Active Problem List   Diagnosis Date Noted  . Simple endometrial hyperplasia 05/29/2017    Past Surgical History:  Procedure Laterality Date  . DILATION AND CURETTAGE OF UTERUS    . FOOT SURGERY Bilateral 1986    OB History    Gravida Para Term Preterm AB Living             2   SAB TAB Ectopic Multiple Live Births                   Home Medications    Prior to Admission medications   Medication Sig Start Date End Date Taking? Authorizing Provider  estradiol (VIVELLE-DOT) 0.05 MG/24HR patch APPLY 1 PATCH TOPICALLY TWICE A WEEK ON  THURSDAYS  AND  SUNDAY 07/30/17   Harrington Challenger, NP  progesterone (PROMETRIUM) 100 MG capsule Take 1 capsule (100 mg total) by mouth daily. At bedtime 05/29/17   Harrington Challenger, NP    Family History Family History  Problem Relation Age of Onset  . Diabetes Maternal Grandmother   . Heart failure Maternal  Grandfather     Social History Social History   Tobacco Use  . Smoking status: Never Smoker  . Smokeless tobacco: Never Used  Substance Use Topics  . Alcohol use: Yes    Alcohol/week: 1.2 oz    Types: 2 Glasses of wine per week  . Drug use: No     Allergies   Patient has no known allergies.   Review of Systems Review of Systems   Physical Exam Triage Vital Signs ED Triage Vitals  Enc Vitals Group     BP 09/21/17 1422 (!) 118/56     Pulse Rate 09/21/17 1422 78     Resp 09/21/17 1422 16     Temp 09/21/17 1422 (!) 97.5 F (36.4 C)     Temp src --      SpO2 09/21/17 1422 100 %     Weight --      Height --      Head Circumference --      Peak Flow --      Pain Score 09/21/17 1424 6     Pain Loc --      Pain Edu? --      Excl. in GC? --    No data found.  Updated Vital Signs  BP (!) 118/56   Pulse 78   Temp (!) 97.5 F (36.4 C)   Resp 16   LMP 09/17/2017   SpO2 100%   Visual Acuity Right Eye Distance:   Left Eye Distance:   Bilateral Distance:    Right Eye Near:   Left Eye Near:    Bilateral Near:     Physical Exam  Constitutional: She is oriented to person, place, and time. She appears well-developed and well-nourished. No distress.  Cardiovascular: Normal rate, regular rhythm and normal heart sounds.  Pulmonary/Chest: Effort normal and breath sounds normal.  Abdominal: There is tenderness in the suprapubic area and left lower quadrant. There is no guarding, no CVA tenderness, no tenderness at McBurney's point and negative Murphy's sign.  Genitourinary: Vagina normal. Uterus is tender. Cervix exhibits no motion tenderness, no discharge and no friability. Left adnexum displays tenderness.  Genitourinary Comments: Mild uterine and adenexal tenderness with bimanual exam; pain primarily superior to adenexa however  Neurological: She is alert and oriented to person, place, and time.  Skin: Skin is warm and dry.     UC Treatments / Results  Labs (all  labs ordered are listed, but only abnormal results are displayed) Labs Reviewed  POCT URINALYSIS DIP (DEVICE)  URINE CYTOLOGY ANCILLARY ONLY    EKG  EKG Interpretation None       Radiology No results found.  Procedures Procedures (including critical care time)  Medications Ordered in UC Medications - No data to display   Initial Impression / Assessment and Plan / UC Course  I have reviewed the triage vital signs and the nursing notes.  Pertinent labs & imaging results that were available during my care of the patient were reviewed by me and considered in my medical decision making (see chart for details).     Non toxic in appearance. Afebrile. Without tachycardia or tachypnea. Discussed with patient constipation vs gas vs ovarian cyst vs infectious process as differential. Urine cytology for yeast and bacteria pending. Miralax, push fluids. nsaids for pain. Follow up next with with obgyn and/or pcp as needed if symptoms persist. Patient verbalized understanding and agreeable to plan.    Final Clinical Impressions(s) / UC Diagnoses   Final diagnoses:  Pelvic pain in female    ED Discharge Orders    None       Controlled Substance Prescriptions [place] Controlled Substance Registry consulted? Not Applicable   Georgetta HaberBurky, Susan Straker B, NP 09/21/17 [phone]

## 2017-09-21 NOTE — Telephone Encounter (Signed)
Patient called c/o bloating, requesting an ultrasound to look at ovaries. I called pt back received her voicemail, I left a detailed message stating Susan Hensley will need to see her for OV first and she can order ultrasound if needed.

## 2017-09-21 NOTE — ED Triage Notes (Signed)
Pt c/o LLQ abdominal pain for a few days, thought maybe it might be gas, pt c/o constipation. nontender to palpation. Denies n/v/d, denies issues with urination.

## 2017-09-26 LAB — URINE CYTOLOGY ANCILLARY ONLY
Bacterial vaginitis: NEGATIVE
Candida vaginitis: NEGATIVE

## 2017-10-31 ENCOUNTER — Emergency Department (HOSPITAL_COMMUNITY)
Admission: EM | Admit: 2017-10-31 | Discharge: 2017-10-31 | Disposition: A | Discharge status: Home / Self Care | Payer: BLUE CROSS/BLUE SHIELD | Attending: Emergency Medicine | Admitting: Emergency Medicine

## 2017-10-31 ENCOUNTER — Emergency Department (HOSPITAL_COMMUNITY): Payer: BLUE CROSS/BLUE SHIELD

## 2017-10-31 ENCOUNTER — Other Ambulatory Visit: Payer: Self-pay

## 2017-10-31 ENCOUNTER — Encounter (HOSPITAL_COMMUNITY): Payer: Self-pay | Admitting: *Deleted

## 2017-10-31 DIAGNOSIS — R102 Pelvic and perineal pain: Secondary | ICD-10-CM | POA: Diagnosis not present

## 2017-10-31 DIAGNOSIS — R1032 Left lower quadrant pain: Secondary | ICD-10-CM | POA: Insufficient documentation

## 2017-10-31 DIAGNOSIS — Z79899 Other long term (current) drug therapy: Secondary | ICD-10-CM | POA: Insufficient documentation

## 2017-10-31 DIAGNOSIS — K5900 Constipation, unspecified: Secondary | ICD-10-CM | POA: Insufficient documentation

## 2017-10-31 LAB — COMPREHENSIVE METABOLIC PANEL
ALBUMIN: 3.6 g/dL (ref 3.5–5.0)
ALT: 23 U/L (ref 14–54)
AST: 23 U/L (ref 15–41)
Alkaline Phosphatase: 58 U/L (ref 38–126)
Anion gap: 9 (ref 5–15)
BILIRUBIN TOTAL: 0.3 mg/dL (ref 0.3–1.2)
BUN: 8 mg/dL (ref 6–20)
CHLORIDE: 105 mmol/L (ref 101–111)
CO2: 24 mmol/L (ref 22–32)
CREATININE: 0.66 mg/dL (ref 0.44–1.00)
Calcium: 8.8 mg/dL — ABNORMAL LOW (ref 8.9–10.3)
GFR calc Af Amer: >60 mL/min (ref >60)
GFR calc non Af Amer: >60 mL/min (ref >60)
GLUCOSE: 80 mg/dL (ref 65–99)
POTASSIUM: 3.9 mmol/L (ref 3.5–5.1)
Sodium: 138 mmol/L (ref 135–145)
Total Protein: 5.8 g/dL — ABNORMAL LOW (ref 6.5–8.1)

## 2017-10-31 LAB — CBC
HEMATOCRIT: 39 % (ref 36.0–46.0)
Hemoglobin: 12.7 g/dL (ref 12.0–15.0)
MCH: 30.2 pg (ref 26.0–34.0)
MCHC: 32.6 g/dL (ref 30.0–36.0)
MCV: 92.9 fL (ref 78.0–100.0)
Platelets: 380 10*3/uL (ref 150–400)
RBC: 4.2 MIL/uL (ref 3.87–5.11)
RDW: 13.6 % (ref 11.5–15.5)
WBC: 4.9 10*3/uL (ref 4.0–10.5)

## 2017-10-31 LAB — URINALYSIS, ROUTINE W REFLEX MICROSCOPIC
BILIRUBIN URINE: NEGATIVE
GLUCOSE, UA: NEGATIVE mg/dL
HGB URINE DIPSTICK: NEGATIVE
KETONES UR: NEGATIVE mg/dL
Leukocytes, UA: NEGATIVE
Nitrite: NEGATIVE
PH: 5 (ref 5.0–8.0)
Protein, ur: NEGATIVE mg/dL
Specific Gravity, Urine: 1.016 (ref 1.005–1.030)

## 2017-10-31 LAB — LIPASE, BLOOD: LIPASE: 29 U/L (ref 11–51)

## 2017-10-31 LAB — I-STAT BETA HCG BLOOD, ED (MC, WL, AP ONLY): I-stat hCG, quantitative: <5 m[IU]/mL (ref <5)

## 2017-10-31 MED ORDER — KETOROLAC TROMETHAMINE 30 MG/ML IJ SOLN
30.0000 mg | Freq: Once | INTRAMUSCULAR | Status: AC
  Administered 2017-10-31: 30 mg via INTRAMUSCULAR
  Filled 2017-10-31: qty 1

## 2017-10-31 MED ORDER — POLYETHYLENE GLYCOL 3350 17 G PO PACK: 17.0000 g | PACK | Freq: Every day | ORAL | 0 refills | Status: DC

## 2017-10-31 NOTE — ED Triage Notes (Signed)
C/o left lower abd. Pain with radiation to back. States she is having problems with lots of gas and her bowel movements have changed. States pain is worse when lying down.

## 2017-10-31 NOTE — Discharge Instructions (Signed)
There are many causes of abdominal pain. Your work up today has been reassuring, with no findings on labs or ultrasound. Your pain may be from constipation. Follow the instructions below regarding this. Alternate between tylenol and motrin as needed for pain, stay well hydrated, use heat to the areas of pain to help with pain as well. You can also consider using over the counter medications such as Gas X or Bean-O to help with gas. Follow up with your regular doctor and/or your GI specialist and/or your OBGYN in 1 week for recheck of symptoms and ongoing management of your issues. Return to the ER for emergent changes or worsening symptoms.   For your constipation: You should start taking miralax as directed, try starting with using it 1-2 times daily until you achieve daily soft stools-- you can use more than 2 doses daily in order to achieve the first bowel movement and then cut back to 1-2 times daily or however often you need to take it in order to continue having the soft daily stools. May consider over the counter colace as well. Increase the fiber and water intake in your diet. See the information below regarding improving your bowel health.    GETTING TO GOOD BOWEL HEALTH. Irregular bowel habits such as constipation and diarrhea can lead to many problems over time.  Having one soft bowel movement a day is the most important way to prevent further problems.  The anorectal canal is designed to handle stretching and feces to safely manage our ability to get rid of solid waste (feces, poop, stool) out of our body.  BUT, hard constipated stools can act like ripping concrete bricks and diarrhea can be a burning fire to this very sensitive area of our body, causing inflamed hemorrhoids, anal fissures, increasing risk is perirectal abscesses, abdominal pain/bloating, an making irritable bowel worse.     The goal: ONE SOFT BOWEL MOVEMENT A DAY!  To have soft, regular bowel movements:  Drink at least 8 tall  glasses of water a day.   Take plenty of fiber.  Fiber is the undigested part of plant food that passes into the colon, acting s ?natures broom? to encourage bowel motility and movement.  Fiber can absorb and hold large amounts of water. This results in a larger, bulkier stool, which is soft and easier to pass. Work gradually over several weeks up to 6 servings a day of fiber (25g a day even more if needed) in the form of: Vegetables -- Root (potatoes, carrots, turnips), leafy green (lettuce, salad greens, celery, spinach), or cooked high residue (cabbage, broccoli, etc) Fruit -- Fresh (unpeeled skin & pulp), Dried (prunes, apricots, cherries, etc ),  or stewed ( applesauce)  Whole grain breads, pasta, etc (whole wheat)  Bran cereals  Bulking Agents -- This type of water-retaining fiber generally is easily obtained each day by one of the following:  Psyllium bran -- The psyllium plant is remarkable because its ground seeds can retain so much water. This product is available as Metamucil, Konsyl, Effersyllium, Per Diem Fiber, or the less expensive generic preparation in drug and health food stores. Although labeled a laxative, it really is not a laxative.  Methylcellulose -- This is another fiber derived from wood which also retains water. It is available as Citrucel. Polyethylene Glycol - and ?artificial? fiber commonly called Miralax or Glycolax.  It is helpful for people with gassy or bloated feelings with regular fiber Flax Seed - a less gassy fiber than psyllium  No reading or other relaxing activity while on the toilet. If bowel movements take longer than 5 minutes, you are too constipated AVOID CONSTIPATION.  High fiber and water intake usually takes care of this.  Sometimes a laxative is needed to stimulate more frequent bowel movements, but  Laxatives are not a good long-term solution as it can wear the colon out. Osmotics (Milk of Magnesia, Fleets phosphosoda, Magnesium citrate, MiraLax,  GoLytely) are safer than  Stimulants (Senokot, Castor Oil, Dulcolax, Ex Lax)    Do not take laxatives for more than 7days in a row.  IF SEVERELY CONSTIPATED, try a Bowel Retraining Program: Do not use laxatives.  Eat a diet high in roughage, such as bran cereals and leafy vegetables.  Drink six (6) ounces of prune or apricot juice each morning.  Eat two (2) large servings of stewed fruit each day.  Take one (1) heaping tablespoon of a psyllium-based bulking agent twice a day. Use sugar-free sweetener when possible to avoid excessive calories.  Eat a normal breakfast.  Set aside 15 minutes after breakfast to sit on the toilet, but do not strain to have a bowel movement.  If you do not have a bowel movement by the third day, use an enema and repeat the above steps.  Controlling diarrhea Switch to liquids and simpler foods for a few days to avoid stressing your intestines further. Avoid dairy products (especially milk & ice cream) for a short time.  The intestines often can lose the ability to digest lactose when stressed. Avoid foods that cause gassiness or bloating.  Typical foods include beans and other legumes, cabbage, broccoli, and dairy foods.  Every person has some sensitivity to other foods, so listen to our body and avoid those foods that trigger problems for you. Adding fiber (Citrucel, Metamucil, psyllium, Miralax) gradually can help thicken stools by absorbing excess fluid and retrain the intestines to act more normally.  Slowly increase the dose over a few weeks.  Too much fiber too soon can backfire and cause cramping & bloating. Probiotics (such as active yogurt, Align, etc) may help repopulate the intestines and colon with normal bacteria and calm down a sensitive digestive tract.  Most studies show it to be of mild help, though, and such products can be costly. Medicines: Bismuth subsalicylate (ex. Kayopectate, Pepto Bismol) every 30 minutes for up to 6 doses can help control  diarrhea.  Avoid if pregnant. Loperamide (Immodium) can slow down diarrhea.  Start with two tablets (4mg  total) first and then try one tablet every 6 hours.  Avoid if you are having fevers or severe pain.  If you are not better or start feeling worse, stop all medicines and call your doctor for advice Call your doctor if you are getting worse or not better.  Sometimes further testing (cultures, endoscopy, X-ray studies, bloodwork, etc) may be needed to help diagnose and treat the cause of the diarrhea.  Managing Pain  Pain after surgery or related to activity is often due to strain/injury to muscle, tendon, nerves and/or incisions.  This pain is usually short-term and will improve in a few months.   Many people find it helpful to do the following things TOGETHER to help speed the process of healing and to get back to regular activity more quickly:  Avoid heavy physical activity  no lifting greater than 20 pounds Do not ?push through? the pain.  Listen to your body and avoid positions and maneuvers than reproduce the pain Walking is okay  as tolerated, but go slowly and stop when getting sore.  Remember: If it hurts to do it, then don?t do it! Take Anti-inflammatory medication  Take with food/snack around the clock for 1-2 weeks This helps the muscle and nerve tissues become less irritable and calm down faster Choose ONE of the following over-the-counter medications: Naproxen 220mg  tabs (ex. Aleve) 1-2 pills twice a day  Ibuprofen 200mg  tabs (ex. Advil, Motrin) 3-4 pills with every meal and just before bedtime Acetaminophen 500mg  tabs (Tylenol) 1-2 pills with every meal and just before bedtime Use a Heating pad or Ice/Cold Pack 4-6 times a day May use warm bath/hottub  or showers Try Gentle Massage and/or Stretching  at the area of pain many times a day stop if you feel pain - do not overdo it  Try these steps together to help you body heal faster and avoid making things get worse.  Doing  just one of these things may not be enough.    If you are not getting better after two weeks or are noticing you are getting worse, contact our office for further advice; we may need to re-evaluate you & see what other things we can do to help.

## 2017-10-31 NOTE — ED Notes (Signed)
Patient transported to Ultrasound 

## 2017-10-31 NOTE — ED Provider Notes (Signed)
[facility] Provider Note   CSN: [id number] Arrival date & time: 10/31/17  [id number]     History   Chief Complaint Chief Complaint  Patient presents with  . Abdominal Pain    HPI Susan Hensley is a 52 y.o. female with a PMHx of endometrial hyperplasia, who presents to the ED with complaints of 2 months of LLQ pain.  Patient states that she went to the urgent care on 09/21/17 and had a pelvic exam, per notes she had adnexal tenderness, wet prep was done and was negative, no other testing was done she was encouraged to use MiraLAX and increase fluids and follow-up with her OB/GYN.  She states that her pain has continued so she came back for repeat evaluation.  She describes the pain as 7/10 constant dull "twinge" LLQ pain that occasionally goes into her lower back and across the lower abdomen, worse with sitting or laying down, improves with standing up, and unrelieved with Aleve and heat use.  She states that her bowels seem to have "slowed" and she has been more constipated since about October, although she reports having a BM this morning.  She feels as though there is a "blockage" in her abdomen that is slowing things down, and wonders whether she should have a pelvic ultrasound to evaluate for ovarian causes.  Her OB/GYN is Maryelizabeth RowanNancy Young at [facility].  She recently had a colonoscopy in October by [place]. [place] and reportedly this was "normal", but since then she's been constipated. Also of note, she started prometrium in September, shortly before she started having constipation issues.   She denies fevers, chills, CP, SOB, nausea/vomiting, diarrhea, obstipation, melena, hematochezia, hematuria, dysuria, vaginal bleeding/discharge, myalgias, arthralgias, numbness, tingling, focal weakness, or any other complaints at this time. Denies recent travel, sick contacts, suspicious food intake, excessive EtOH use, frequent NSAID use, or prior abd  surgeries.    The history is provided by the patient and medical records. No language interpreter was used.  Abdominal Pain   This is a new problem. The current episode started more than 1 week ago. The problem occurs constantly. The problem has not changed since onset.The pain is associated with an unknown factor. The pain is located in the LLQ. The quality of the pain is dull. The pain is at a severity of 7/10. The pain is moderate. Associated symptoms include constipation. Pertinent negatives include fever, diarrhea, flatus, hematochezia, melena, nausea, vomiting, dysuria, hematuria, arthralgias and myalgias. The symptoms are aggravated by certain positions. The symptoms are relieved by standing.    History reviewed. No pertinent past medical history.  Patient Active Problem List   Diagnosis Date Noted  . Simple endometrial hyperplasia 05/29/2017    Past Surgical History:  Procedure Laterality Date  . DILATION AND CURETTAGE OF UTERUS    . FOOT SURGERY Bilateral 1986    OB History    Gravida Para Term Preterm AB Living             2   SAB TAB Ectopic Multiple Live Births                   Home Medications    Prior to Admission medications   Medication Sig Start Date End Date Taking? Authorizing Provider  estradiol (VIVELLE-DOT) 0.05 MG/24HR patch APPLY 1 PATCH TOPICALLY TWICE A WEEK ON  THURSDAYS  AND  SUNDAY 07/30/17   Harrington ChallengerYoung, Nancy J, NP  progesterone (PROMETRIUM) 100 MG capsule Take 1  capsule (100 mg total) by mouth daily. At bedtime 05/29/17   Harrington Challenger, NP    Family History Family History  Problem Relation Age of Onset  . Diabetes Maternal Grandmother   . Heart failure Maternal Grandfather     Social History Social History   Tobacco Use  . Smoking status: Never Smoker  . Smokeless tobacco: Never Used  Substance Use Topics  . Alcohol use: Yes    Alcohol/week: 1.2 oz    Types: 2 Glasses of wine per week  . Drug use: No     Allergies   Patient has no  known allergies.   Review of Systems Review of Systems  Constitutional: Negative for chills and fever.  Respiratory: Negative for shortness of breath.   Cardiovascular: Negative for chest pain.  Gastrointestinal: Positive for abdominal pain and constipation. Negative for blood in stool, diarrhea, flatus, hematochezia, melena, nausea and vomiting.  Genitourinary: Negative for dysuria, hematuria, vaginal bleeding and vaginal discharge.  Musculoskeletal: Negative for arthralgias and myalgias.  Skin: Negative for color change.  Allergic/Immunologic: Negative for immunocompromised state.  Neurological: Negative for weakness and numbness.  Psychiatric/Behavioral: Negative for confusion.   All other systems reviewed and are negative for acute change except as noted in the HPI.    Physical Exam Updated Vital Signs BP 117/77   Pulse (!) 57   Temp 98.3 F (36.8 C) (Oral)   Resp 18   Ht 5\' 2"  (1.575 m)   Wt 58.1 kg (128 lb)   SpO2 100%   BMI 23.41 kg/m   Physical Exam  Constitutional: She is oriented to person, place, and time. Vital signs are normal. She appears well-developed and well-nourished.  Non-toxic appearance. No distress.  Afebrile, nontoxic, NAD  HENT:  Head: Normocephalic and atraumatic.  Mouth/Throat: Oropharynx is clear and moist and mucous membranes are normal.  Eyes: Conjunctivae and EOM are normal. Right eye exhibits no discharge. Left eye exhibits no discharge.  Neck: Normal range of motion. Neck supple.  Cardiovascular: Normal rate, regular rhythm, normal heart sounds and intact distal pulses. Exam reveals no gallop and no friction rub.  No murmur heard. Pulmonary/Chest: Effort normal and breath sounds normal. No respiratory distress. She has no decreased breath sounds. She has no wheezes. She has no rhonchi. She has no rales.  Abdominal: Soft. Normal appearance and bowel sounds are normal. She exhibits no distension. There is tenderness in the right lower quadrant,  suprapubic area and left lower quadrant. There is no rigidity, no rebound, no guarding, no CVA tenderness, no tenderness at McBurney's point and negative Murphy's sign.  Soft, nondistended, +BS throughout, with mild lower abd TTP most focally in LLQ, no r/g/r, neg murphy's, neg mcburney's, no CVA TTP   Genitourinary:  Genitourinary Comments: Pt declined  Musculoskeletal: Normal range of motion.  Neurological: She is alert and oriented to person, place, and time. She has normal strength. No sensory deficit.  Skin: Skin is warm, dry and intact. No rash noted.  Psychiatric: She has a normal mood and affect.  Nursing note and vitals reviewed.    ED Treatments / Results  Labs (all labs ordered are listed, but only abnormal results are displayed) Labs Reviewed  COMPREHENSIVE METABOLIC PANEL - Abnormal; Notable for the following components:      Result Value   Calcium 8.8 (*)    Total Protein 5.8 (*)    All other components within normal limits  LIPASE, BLOOD  CBC  URINALYSIS, ROUTINE W REFLEX MICROSCOPIC  I-STAT BETA HCG BLOOD, ED ([facility], [facility], [facility] ONLY)    EKG  EKG Interpretation None       Radiology US Pelvis Transvanginal Non-ob (tv Only)  Result Date: 10/31/2017 CLINICAL DATA:  52 year old with pelvic pain. LMP 10/09/2017. No previous relevant surgery. EXAM: TRANSABDOMINAL AND TRANSVAGINAL ULTRASOUND OF PELVIS DOPPLER ULTRASOUND OF OVARIES TECHNIQUE: Both transabdominal and transvaginal ultrasound examinations of the pelvis were performed. Transabdominal technique was performed for global imaging of the pelvis including uterus, ovaries, adnexal regions, and pelvic cul-de-sac. It was necessary to proceed with endovaginal exam following the transabdominal exam to visualize the endometrium and ovaries to better advantage. Color and duplex Doppler ultrasound was utilized to evaluate blood flow to the ovaries. COMPARISON:  None. FINDINGS: Uterus Measurements: 8.1 x 3.4 x 4.9 cm. Mild  heterogeneity of the myometrium without focal lesion. Endometrium Thickness: 7 mm. No focal abnormality visualized. Small amount of fluid in the endocervical canal. Small cervical nabothian cysts. Right ovary Measurements: 2.5 x 0.9 x 2.1 cm. Normal appearance/no adnexal mass. Normal blood flow with color Doppler. Left ovary Measurements: 1.9 x 1.0 x 1.8 cm. Normal appearance/no adnexal mass. Normal blood flow with color Doppler. Pulsed Doppler evaluation of both ovaries demonstrates normal low-resistance arterial and venous waveforms. Other findings No abnormal free fluid. IMPRESSION: No significant findings.  No evidence of ovarian torsion. Electronically Signed   By: Carey Bullocks M.D.   On: 10/31/2017 13:30   US Pelvis (transabdominal Only)  Result Date: 10/31/2017 CLINICAL DATA:  52 year old with pelvic pain. LMP 10/09/2017. No previous relevant surgery. EXAM: TRANSABDOMINAL AND TRANSVAGINAL ULTRASOUND OF PELVIS DOPPLER ULTRASOUND OF OVARIES TECHNIQUE: Both transabdominal and transvaginal ultrasound examinations of the pelvis were performed. Transabdominal technique was performed for global imaging of the pelvis including uterus, ovaries, adnexal regions, and pelvic cul-de-sac. It was necessary to proceed with endovaginal exam following the transabdominal exam to visualize the endometrium and ovaries to better advantage. Color and duplex Doppler ultrasound was utilized to evaluate blood flow to the ovaries. COMPARISON:  None. FINDINGS: Uterus Measurements: 8.1 x 3.4 x 4.9 cm. Mild heterogeneity of the myometrium without focal lesion. Endometrium Thickness: 7 mm. No focal abnormality visualized. Small amount of fluid in the endocervical canal. Small cervical nabothian cysts. Right ovary Measurements: 2.5 x 0.9 x 2.1 cm. Normal appearance/no adnexal mass. Normal blood flow with color Doppler. Left ovary Measurements: 1.9 x 1.0 x 1.8 cm. Normal appearance/no adnexal mass. Normal blood flow with color  Doppler. Pulsed Doppler evaluation of both ovaries demonstrates normal low-resistance arterial and venous waveforms. Other findings No abnormal free fluid. IMPRESSION: No significant findings.  No evidence of ovarian torsion. Electronically Signed   By: Carey Bullocks M.D.   On: 10/31/2017 13:30   US Pelvic Doppler (torsion R/o Or Mass Arterial Flow)  Result Date: 10/31/2017 CLINICAL DATA:  52 year old with pelvic pain. LMP 10/09/2017. No previous relevant surgery. EXAM: TRANSABDOMINAL AND TRANSVAGINAL ULTRASOUND OF PELVIS DOPPLER ULTRASOUND OF OVARIES TECHNIQUE: Both transabdominal and transvaginal ultrasound examinations of the pelvis were performed. Transabdominal technique was performed for global imaging of the pelvis including uterus, ovaries, adnexal regions, and pelvic cul-de-sac. It was necessary to proceed with endovaginal exam following the transabdominal exam to visualize the endometrium and ovaries to better advantage. Color and duplex Doppler ultrasound was utilized to evaluate blood flow to the ovaries. COMPARISON:  None. FINDINGS: Uterus Measurements: 8.1 x 3.4 x 4.9 cm. Mild heterogeneity of the myometrium without focal lesion. Endometrium Thickness: 7 mm. No focal abnormality visualized. Small amount  of fluid in the endocervical canal. Small cervical nabothian cysts. Right ovary Measurements: 2.5 x 0.9 x 2.1 cm. Normal appearance/no adnexal mass. Normal blood flow with color Doppler. Left ovary Measurements: 1.9 x 1.0 x 1.8 cm. Normal appearance/no adnexal mass. Normal blood flow with color Doppler. Pulsed Doppler evaluation of both ovaries demonstrates normal low-resistance arterial and venous waveforms. Other findings No abnormal free fluid. IMPRESSION: No significant findings.  No evidence of ovarian torsion. Electronically Signed   By: Carey Bullocks M.D.   On: 10/31/2017 13:30    Procedures Procedures (including critical care time)  Medications Ordered in ED Medications    ketorolac (TORADOL) 30 MG/ML injection 30 mg (30 mg Intramuscular Given 10/31/17 1247)     Initial Impression / Assessment and Plan / ED Course  I have reviewed the triage vital signs and the nursing notes.  Pertinent labs & imaging results that were available during my care of the patient were reviewed by me and considered in my medical decision making (see chart for details).     52 y.o. female here with 2 months of LLQ pain, was seen at [facility]/17 for same, had pelvic exam which showed b/l adnexa tenderness, wet prep neg, no further testing done at that time. Has stated she's been more constipated and thinks there's a "blockage" that's slowed her GI tract down. No n/v. On exam, mild lower abd TTP, nonperitoneal, no mcburney's point tenderness. Work up thus far reveals: betaHCG neg, U/A unremarkable, CMP/CBC/lipase WNL. Pt declined pelvic exam, will proceed with pelvic U/S to further evaluate the pelvic organs. Will give toradol and reassess shortly.   1:47 PM Pelvic U/S unremarkable without any acute findings. Overall, her symptoms could be from constipation, which could be in part due to the prometrium she's taking. Will start on miralax, advised high fiber diet and increased water intake. Discussed other OTC remedies for symptomatic relief. F/up with PCP/GI/OBGYN in 1wk for recheck of symptoms and ongoing management of this issue. I explained the diagnosis and have given explicit precautions to return to the ER including for any other new or worsening symptoms. The patient understands and accepts the medical plan as it's been dictated and I have answered their questions. Discharge instructions concerning home care and prescriptions have been given. The patient is STABLE and is discharged to home in good condition.    Final Clinical Impressions(s) / ED Diagnoses   Final diagnoses:  Pelvic pain  LLQ pain  Constipation, unspecified constipation type    ED Discharge Orders         Ordered    polyethylene glycol (MIRALAX / GLYCOLAX) packet  Daily     10/31/17 [address], [place], [place] 10/31/17 1348    Rolland Porter, MD 11/01/17 2203

## 2017-11-28 DIAGNOSIS — F330 Major depressive disorder, recurrent, mild: Secondary | ICD-10-CM | POA: Diagnosis not present

## 2017-11-28 DIAGNOSIS — F901 Attention-deficit hyperactivity disorder, predominantly hyperactive type: Secondary | ICD-10-CM | POA: Diagnosis not present

## 2018-01-09 DIAGNOSIS — F901 Attention-deficit hyperactivity disorder, predominantly hyperactive type: Secondary | ICD-10-CM | POA: Diagnosis not present

## 2018-01-09 DIAGNOSIS — F330 Major depressive disorder, recurrent, mild: Secondary | ICD-10-CM | POA: Diagnosis not present

## 2018-03-11 DIAGNOSIS — S81012A Laceration without foreign body, left knee, initial encounter: Secondary | ICD-10-CM | POA: Diagnosis not present

## 2018-03-11 DIAGNOSIS — Z23 Encounter for immunization: Secondary | ICD-10-CM | POA: Diagnosis not present

## 2018-05-01 ENCOUNTER — Other Ambulatory Visit: Payer: Self-pay | Admitting: Women's Health

## 2018-05-01 DIAGNOSIS — Z1231 Encounter for screening mammogram for malignant neoplasm of breast: Secondary | ICD-10-CM

## 2018-05-03 ENCOUNTER — Ambulatory Visit
Admission: RE | Admit: 2018-05-03 | Discharge: 2018-05-03 | Disposition: A | Discharge status: Home / Self Care | Payer: BLUE CROSS/BLUE SHIELD | Source: Ambulatory Visit | Attending: Women's Health | Admitting: Women's Health

## 2018-05-03 DIAGNOSIS — Z1231 Encounter for screening mammogram for malignant neoplasm of breast: Secondary | ICD-10-CM

## 2018-05-21 DIAGNOSIS — F330 Major depressive disorder, recurrent, mild: Secondary | ICD-10-CM | POA: Diagnosis not present

## 2018-05-21 DIAGNOSIS — F901 Attention-deficit hyperactivity disorder, predominantly hyperactive type: Secondary | ICD-10-CM | POA: Diagnosis not present

## 2018-06-04 DIAGNOSIS — F330 Major depressive disorder, recurrent, mild: Secondary | ICD-10-CM | POA: Diagnosis not present

## 2018-06-04 DIAGNOSIS — F901 Attention-deficit hyperactivity disorder, predominantly hyperactive type: Secondary | ICD-10-CM | POA: Diagnosis not present

## 2018-06-18 ENCOUNTER — Ambulatory Visit (INDEPENDENT_AMBULATORY_CARE_PROVIDER_SITE_OTHER): Payer: BLUE CROSS/BLUE SHIELD | Admitting: Women's Health

## 2018-06-18 ENCOUNTER — Encounter: Payer: Self-pay | Admitting: Women's Health

## 2018-06-18 VITALS — BP 110/68 | Ht 62.0 in | Wt 129.0 lb

## 2018-06-18 DIAGNOSIS — Z01419 Encounter for gynecological examination (general) (routine) without abnormal findings: Secondary | ICD-10-CM | POA: Diagnosis not present

## 2018-06-18 DIAGNOSIS — N951 Menopausal and female climacteric states: Secondary | ICD-10-CM | POA: Diagnosis not present

## 2018-06-18 MED ORDER — PROGESTERONE MICRONIZED 100 MG PO CAPS: 100.0000 mg | ORAL_CAPSULE | Freq: Every day | ORAL | 4 refills | Status: DC

## 2018-06-18 MED ORDER — ESTRADIOL 0.05 MG/24HR TD PTTW: MEDICATED_PATCH | TRANSDERMAL | 4 refills | Status: DC

## 2018-06-18 NOTE — Progress Notes (Signed)
Susan Hensley November 10, 1965 [id number]    History:    Presents for annual exam.  Menopausal on HRT with no bleeding.  Normal Pap and mammogram history.  2013 D&C with endometrial hyperplasia in  [place],  2013 Mirena IUD that was removed 2018 and then  on HRT.  2018- colonoscopy.  Past medical history, past surgical history, family history and social history were all reviewed and documented in the EPIC chart.  52 year old twins.  Daughter hospitalized for anorexia, in therapy still struggling.   ROS:  A ROS was performed and pertinent positives and negatives are included.  Exam:  Vitals:   06/18/18 1032  BP: 110/68  Weight: 129 lb (58.5 kg)  Height: 5\' 2"  (1.575 m)   Body mass index is 23.59 kg/m.   General appearance:  Normal Thyroid:  Symmetrical, normal in size, without palpable masses or nodularity. Respiratory  Auscultation:  Clear without wheezing or rhonchi Cardiovascular  Auscultation:  Regular rate, without rubs, murmurs or gallops  Edema/varicosities:  Not grossly evident Abdominal  Soft,nontender, without masses, guarding or rebound.  Liver/spleen:  No organomegaly noted  Hernia:  None appreciated  Skin  Inspection:  Grossly normal   Breasts: Examined lying and sitting.     Right: Without masses, retractions, discharge or axillary adenopathy.     Left: Without masses, retractions, discharge or axillary adenopathy. Gentitourinary   Inguinal/mons:  Normal without inguinal adenopathy  External genitalia:  Normal  BUS/Urethra/Skene's glands:  Normal  Vagina:  Normal  Cervix:  Normal  Uterus:   normal in size, shape and contour.  Midline and mobile  Adnexa/parametria:     Rt: Without masses or tenderness.   Lt: Without masses or tenderness.  Anus and perineum: Normal  Digital rectal exam: Normal sphincter tone without palpated masses or tenderness  Assessment/Plan:  52 y.o. MWF G1, P2 for annual exam with no complaints.  Postmenopausal on HRT with no  bleeding. Situational stress daughter with anorexia is in counseling 2013 endometrial hyperplasia [facility]-primary care  Pain: HRT reviewed risks of blood clots, strokes and breast cancer.  Had been on cyclic Prometrium but states feels better on daily Prometrium.  Reviewed importance of reporting any irregular bleeding or spotting.  Vivelle 0.05 patch weekly and Prometrium 100 mg p.o. daily at bedtime.  SBE's, continue annual screening mammogram, calcium rich foods, vitamin D 2000 daily encouraged.  Continue counseling as needed.  Leisure activities regular daily weightbearing type exercise encouraged.  Pap with HR HPV typing, new screening guidelines reviewed.   [facility], 10:36 AM 06/18/2018

## 2018-06-18 NOTE — Patient Instructions (Signed)
Health Maintenance for Postmenopausal Women Menopause is a normal process in which your reproductive ability comes to an end. This process happens gradually over a span of months to years, usually between the ages of 22 and 9. Menopause is complete when you have missed 12 consecutive menstrual periods. It is important to talk with your health care provider about some of the most common conditions that affect postmenopausal women, such as heart disease, cancer, and bone loss (osteoporosis). Adopting a healthy lifestyle and getting preventive care can help to promote your health and wellness. Those actions can also lower your chances of developing some of these common conditions. What should I know about menopause? During menopause, you may experience a number of symptoms, such as:  Moderate-to-severe hot flashes.  Night sweats.  Decrease in sex drive.  Mood swings.  Headaches.  Tiredness.  Irritability.  Memory problems.  Insomnia.  Choosing to treat or not to treat menopausal changes is an individual decision that you make with your health care provider. What should I know about hormone replacement therapy and supplements? Hormone therapy products are effective for treating symptoms that are associated with menopause, such as hot flashes and night sweats. Hormone replacement carries certain risks, especially as you become older. If you are thinking about using estrogen or estrogen with progestin treatments, discuss the benefits and risks with your health care provider. What should I know about heart disease and stroke? Heart disease, heart attack, and stroke become more likely as you age. This may be due, in part, to the hormonal changes that your body experiences during menopause. These can affect how your body processes dietary fats, triglycerides, and cholesterol. Heart attack and stroke are both medical emergencies. There are many things that you can do to help prevent heart disease  and stroke:  Have your blood pressure checked at least every 1-2 years. High blood pressure causes heart disease and increases the risk of stroke.  If you are 53-22 years old, ask your health care provider if you should take aspirin to prevent a heart attack or a stroke.  Do not use any tobacco products, including cigarettes, chewing tobacco, or electronic cigarettes. If you need help quitting, ask your health care provider.  It is important to eat a healthy diet and maintain a healthy weight. ? Be sure to include plenty of vegetables, fruits, low-fat dairy products, and lean protein. ? Avoid eating foods that are high in solid fats, added sugars, or salt (sodium).  Get regular exercise. This is one of the most important things that you can do for your health. ? Try to exercise for at least 150 minutes each week. The type of exercise that you do should increase your heart rate and make you sweat. This is known as moderate-intensity exercise. ? Try to do strengthening exercises at least twice each week. Do these in addition to the moderate-intensity exercise.  Know your numbers.Ask your health care provider to check your cholesterol and your blood glucose. Continue to have your blood tested as directed by your health care provider.  What should I know about cancer screening? There are several types of cancer. Take the following steps to reduce your risk and to catch any cancer development as early as possible. Breast Cancer  Practice breast self-awareness. ? This means understanding how your breasts normally appear and feel. ? It also means doing regular breast self-exams. Let your health care provider know about any changes, no matter how small.  If you are 40  or older, have a clinician do a breast exam (clinical breast exam or CBE) every year. Depending on your age, family history, and medical history, it may be recommended that you also have a yearly breast X-ray (mammogram).  If you  have a family history of breast cancer, talk with your health care provider about genetic screening.  If you are at high risk for breast cancer, talk with your health care provider about having an MRI and a mammogram every year.  Breast cancer (BRCA) gene test is recommended for women who have family members with BRCA-related cancers. Results of the assessment will determine the need for genetic counseling and BRCA1 and for BRCA2 testing. BRCA-related cancers include these types: ? Breast. This occurs in males or females. ? Ovarian. ? Tubal. This may also be called fallopian tube cancer. ? Cancer of the abdominal or pelvic lining (peritoneal cancer). ? Prostate. ? Pancreatic.  Cervical, Uterine, and Ovarian Cancer Your health care provider may recommend that you be screened regularly for cancer of the pelvic organs. These include your ovaries, uterus, and vagina. This screening involves a pelvic exam, which includes checking for microscopic changes to the surface of your cervix (Pap test).  For women ages 21-65, health care providers may recommend a pelvic exam and a Pap test every three years. For women ages 79-65, they may recommend the Pap test and pelvic exam, combined with testing for human papilloma virus (HPV), every five years. Some types of HPV increase your risk of cervical cancer. Testing for HPV may also be done on women of any age who have unclear Pap test results.  Other health care providers may not recommend any screening for nonpregnant women who are considered low risk for pelvic cancer and have no symptoms. Ask your health care provider if a screening pelvic exam is right for you.  If you have had past treatment for cervical cancer or a condition that could lead to cancer, you need Pap tests and screening for cancer for at least 20 years after your treatment. If Pap tests have been discontinued for you, your risk factors (such as having a new sexual partner) need to be  reassessed to determine if you should start having screenings again. Some women have medical problems that increase the chance of getting cervical cancer. In these cases, your health care provider may recommend that you have screening and Pap tests more often.  If you have a family history of uterine cancer or ovarian cancer, talk with your health care provider about genetic screening.  If you have vaginal bleeding after reaching menopause, tell your health care provider.  There are currently no reliable tests available to screen for ovarian cancer.  Lung Cancer Lung cancer screening is recommended for adults 69-62 years old who are at high risk for lung cancer because of a history of smoking. A yearly low-dose CT scan of the lungs is recommended if you:  Currently smoke.  Have a history of at least 30 pack-years of smoking and you currently smoke or have quit within the past 15 years. A pack-year is smoking an average of one pack of cigarettes per day for one year.  Yearly screening should:  Continue until it has been 15 years since you quit.  Stop if you develop a health problem that would prevent you from having lung cancer treatment.  Colorectal Cancer  This type of cancer can be detected and can often be prevented.  Routine colorectal cancer screening usually begins at  age 42 and continues through age 45.  If you have risk factors for colon cancer, your health care provider may recommend that you be screened at an earlier age.  If you have a family history of colorectal cancer, talk with your health care provider about genetic screening.  Your health care provider may also recommend using home test kits to check for hidden blood in your stool.  A small camera at the end of a tube can be used to examine your colon directly (sigmoidoscopy or colonoscopy). This is done to check for the earliest forms of colorectal cancer.  Direct examination of the colon should be repeated every  5-10 years until age 71. However, if early forms of precancerous polyps or small growths are found or if you have a family history or genetic risk for colorectal cancer, you may need to be screened more often.  Skin Cancer  Check your skin from head to toe regularly.  Monitor any moles. Be sure to tell your health care provider: ? About any new moles or changes in moles, especially if there is a change in a mole's shape or color. ? If you have a mole that is larger than the size of a pencil eraser.  If any of your family members has a history of skin cancer, especially at a young age, talk with your health care provider about genetic screening.  Always use sunscreen. Apply sunscreen liberally and repeatedly throughout the day.  Whenever you are outside, protect yourself by wearing long sleeves, pants, a wide-brimmed hat, and sunglasses.  What should I know about osteoporosis? Osteoporosis is a condition in which bone destruction happens more quickly than new bone creation. After menopause, you may be at an increased risk for osteoporosis. To help prevent osteoporosis or the bone fractures that can happen because of osteoporosis, the following is recommended:  If you are 46-71 years old, get at least 1,000 mg of calcium and at least 600 mg of vitamin D per day.  If you are older than age 55 but younger than age 65, get at least 1,200 mg of calcium and at least 600 mg of vitamin D per day.  If you are older than age 54, get at least 1,200 mg of calcium and at least 800 mg of vitamin D per day.  Smoking and excessive alcohol intake increase the risk of osteoporosis. Eat foods that are rich in calcium and vitamin D, and do weight-bearing exercises several times each week as directed by your health care provider. What should I know about how menopause affects my mental health? Depression may occur at any age, but it is more common as you become older. Common symptoms of depression  include:  Low or sad mood.  Changes in sleep patterns.  Changes in appetite or eating patterns.  Feeling an overall lack of motivation or enjoyment of activities that you previously enjoyed.  Frequent crying spells.  Talk with your health care provider if you think that you are experiencing depression. What should I know about immunizations? It is important that you get and maintain your immunizations. These include:  Tetanus, diphtheria, and pertussis (Tdap) booster vaccine.  Influenza every year before the flu season begins.  Pneumonia vaccine.  Shingles vaccine.  Your health care provider may also recommend other immunizations. This information is not intended to replace advice given to you by your health care provider. Make sure you discuss any questions you have with your health care provider. Document Released: 10/13/2005  Document Revised: 03/10/2016 Document Reviewed: 05/25/2015 Elsevier Interactive Patient Education  2018 Elsevier Inc.  

## 2018-06-18 NOTE — Addendum Note (Signed)
Addended by: Becky Sax on: 06/18/2018 03:56 PM   Modules accepted: Orders

## 2018-06-20 LAB — PAP, TP IMAGING W/ HPV RNA, RFLX HPV TYPE 16,18/45: HPV DNA High Risk: NOT DETECTED

## 2018-06-21 ENCOUNTER — Other Ambulatory Visit: Payer: Self-pay | Admitting: Women's Health

## 2018-06-21 DIAGNOSIS — N951 Menopausal and female climacteric states: Secondary | ICD-10-CM

## 2018-06-25 DIAGNOSIS — F330 Major depressive disorder, recurrent, mild: Secondary | ICD-10-CM | POA: Diagnosis not present

## 2018-06-25 DIAGNOSIS — Z0000 Encounter for general adult medical examination without abnormal findings: Secondary | ICD-10-CM | POA: Diagnosis not present

## 2018-06-25 DIAGNOSIS — Z111 Encounter for screening for respiratory tuberculosis: Secondary | ICD-10-CM | POA: Diagnosis not present

## 2018-06-25 DIAGNOSIS — F901 Attention-deficit hyperactivity disorder, predominantly hyperactive type: Secondary | ICD-10-CM | POA: Diagnosis not present

## 2018-06-25 DIAGNOSIS — Z13220 Encounter for screening for lipoid disorders: Secondary | ICD-10-CM | POA: Diagnosis not present

## 2018-07-12 DIAGNOSIS — S0501XA Injury of conjunctiva and corneal abrasion without foreign body, right eye, initial encounter: Secondary | ICD-10-CM | POA: Diagnosis not present

## 2018-08-09 DIAGNOSIS — Z23 Encounter for immunization: Secondary | ICD-10-CM | POA: Diagnosis not present

## 2018-10-24 DIAGNOSIS — F901 Attention-deficit hyperactivity disorder, predominantly hyperactive type: Secondary | ICD-10-CM | POA: Diagnosis not present

## 2018-10-24 DIAGNOSIS — F330 Major depressive disorder, recurrent, mild: Secondary | ICD-10-CM | POA: Diagnosis not present

## 2019-02-05 DIAGNOSIS — L237 Allergic contact dermatitis due to plants, except food: Secondary | ICD-10-CM | POA: Diagnosis not present

## 2019-02-05 DIAGNOSIS — L299 Pruritus, unspecified: Secondary | ICD-10-CM | POA: Diagnosis not present

## 2019-02-05 DIAGNOSIS — Z411 Encounter for cosmetic surgery: Secondary | ICD-10-CM | POA: Diagnosis not present

## 2019-02-05 DIAGNOSIS — D225 Melanocytic nevi of trunk: Secondary | ICD-10-CM | POA: Diagnosis not present

## 2019-02-05 DIAGNOSIS — D2239 Melanocytic nevi of other parts of face: Secondary | ICD-10-CM | POA: Diagnosis not present

## 2019-02-05 DIAGNOSIS — D485 Neoplasm of uncertain behavior of skin: Secondary | ICD-10-CM | POA: Diagnosis not present

## 2019-03-22 DIAGNOSIS — B009 Herpesviral infection, unspecified: Secondary | ICD-10-CM | POA: Diagnosis not present

## 2019-03-22 DIAGNOSIS — L237 Allergic contact dermatitis due to plants, except food: Secondary | ICD-10-CM | POA: Diagnosis not present

## 2019-05-14 ENCOUNTER — Other Ambulatory Visit: Payer: Self-pay

## 2019-05-14 DIAGNOSIS — N951 Menopausal and female climacteric states: Secondary | ICD-10-CM

## 2019-05-14 MED ORDER — PROGESTERONE MICRONIZED 100 MG PO CAPS: 100.0000 mg | ORAL_CAPSULE | Freq: Every day | ORAL | 0 refills | Status: DC

## 2019-05-16 ENCOUNTER — Other Ambulatory Visit: Payer: Self-pay

## 2019-05-16 DIAGNOSIS — N951 Menopausal and female climacteric states: Secondary | ICD-10-CM

## 2019-05-16 MED ORDER — ESTRADIOL 0.05 MG/24HR TD PTTW: MEDICATED_PATCH | TRANSDERMAL | 0 refills | Status: DC

## 2019-07-02 ENCOUNTER — Other Ambulatory Visit: Payer: Self-pay

## 2019-07-02 ENCOUNTER — Ambulatory Visit (INDEPENDENT_AMBULATORY_CARE_PROVIDER_SITE_OTHER): Payer: BC Managed Care – PPO | Admitting: Women's Health

## 2019-07-02 ENCOUNTER — Encounter: Payer: Self-pay | Admitting: Women's Health

## 2019-07-02 VITALS — BP 110/78 | Ht 62.0 in | Wt 132.0 lb

## 2019-07-02 DIAGNOSIS — N951 Menopausal and female climacteric states: Secondary | ICD-10-CM | POA: Diagnosis not present

## 2019-07-02 DIAGNOSIS — N926 Irregular menstruation, unspecified: Secondary | ICD-10-CM

## 2019-07-02 DIAGNOSIS — Z01419 Encounter for gynecological examination (general) (routine) without abnormal findings: Secondary | ICD-10-CM | POA: Diagnosis not present

## 2019-07-02 MED ORDER — PROGESTERONE MICRONIZED 200 MG PO CAPS: ORAL_CAPSULE | ORAL | 4 refills | Status: DC

## 2019-07-02 MED ORDER — PROGESTERONE MICRONIZED 100 MG PO CAPS: 100.0000 mg | ORAL_CAPSULE | Freq: Every day | ORAL | 4 refills | Status: DC

## 2019-07-02 MED ORDER — ESTRADIOL 0.075 MG/24HR TD PTTW: 1.0000 | MEDICATED_PATCH | TRANSDERMAL | 4 refills | Status: DC

## 2019-07-02 NOTE — Patient Instructions (Signed)
Good to see you! Health Maintenance for Postmenopausal Women Menopause is a normal process in which your ability to get pregnant comes to an end. This process happens slowly over many months or years, usually between the ages of 48 and 55. Menopause is complete when you have missed your menstrual periods for 12 months. It is important to talk with your health care provider about some of the most common conditions that affect women after menopause (postmenopausal women). These include heart disease, cancer, and bone loss (osteoporosis). Adopting a healthy lifestyle and getting preventive care can help to promote your health and wellness. The actions you take can also lower your chances of developing some of these common conditions. What should I know about menopause? During menopause, you may get a number of symptoms, such as:  Hot flashes. These can be moderate or severe.  Night sweats.  Decrease in sex drive.  Mood swings.  Headaches.  Tiredness.  Irritability.  Memory problems.  Insomnia. Choosing to treat or not to treat these symptoms is a decision that you make with your health care provider. Do I need hormone replacement therapy?  Hormone replacement therapy is effective in treating symptoms that are caused by menopause, such as hot flashes and night sweats.  Hormone replacement carries certain risks, especially as you become older. If you are thinking about using estrogen or estrogen with progestin, discuss the benefits and risks with your health care provider. What is my risk for heart disease and stroke? The risk of heart disease, heart attack, and stroke increases as you age. One of the causes may be a change in the body's hormones during menopause. This can affect how your body uses dietary fats, triglycerides, and cholesterol. Heart attack and stroke are medical emergencies. There are many things that you can do to help prevent heart disease and stroke. Watch your blood  pressure  High blood pressure causes heart disease and increases the risk of stroke. This is more likely to develop in people who have high blood pressure readings, are of African descent, or are overweight.  Have your blood pressure checked: ? Every 3-5 years if you are 18-39 years of age. ? Every year if you are 40 years old or older. Eat a healthy diet   Eat a diet that includes plenty of vegetables, fruits, low-fat dairy products, and lean protein.  Do not eat a lot of foods that are high in solid fats, added sugars, or sodium. Get regular exercise Get regular exercise. This is one of the most important things you can do for your health. Most adults should:  Try to exercise for at least 150 minutes each week. The exercise should increase your heart rate and make you sweat (moderate-intensity exercise).  Try to do strengthening exercises at least twice each week. Do these in addition to the moderate-intensity exercise.  Spend less time sitting. Even light physical activity can be beneficial. Other tips  Work with your health care provider to achieve or maintain a healthy weight.  Do not use any products that contain nicotine or tobacco, such as cigarettes, e-cigarettes, and chewing tobacco. If you need help quitting, ask your health care provider.  Know your numbers. Ask your health care provider to check your cholesterol and your blood sugar (glucose). Continue to have your blood tested as directed by your health care provider. Do I need screening for cancer? Depending on your health history and family history, you may need to have cancer screening at different stages   of your life. This may include screening for:  Breast cancer.  Cervical cancer.  Lung cancer.  Colorectal cancer. What is my risk for osteoporosis? After menopause, you may be at increased risk for osteoporosis. Osteoporosis is a condition in which bone destruction happens more quickly than new bone creation.  To help prevent osteoporosis or the bone fractures that can happen because of osteoporosis, you may take the following actions:  If you are 19-50 years old, get at least 1,000 mg of calcium and at least 600 mg of vitamin D per day.  If you are older than age 50 but younger than age 70, get at least 1,200 mg of calcium and at least 600 mg of vitamin D per day.  If you are older than age 70, get at least 1,200 mg of calcium and at least 800 mg of vitamin D per day. Smoking and drinking excessive alcohol increase the risk of osteoporosis. Eat foods that are rich in calcium and vitamin D, and do weight-bearing exercises several times each week as directed by your health care provider. How does menopause affect my mental health? Depression may occur at any age, but it is more common as you become older. Common symptoms of depression include:  Low or sad mood.  Changes in sleep patterns.  Changes in appetite or eating patterns.  Feeling an overall lack of motivation or enjoyment of activities that you previously enjoyed.  Frequent crying spells. Talk with your health care provider if you think that you are experiencing depression. General instructions See your health care provider for regular wellness exams and vaccines. This may include:  Scheduling regular health, dental, and eye exams.  Getting and maintaining your vaccines. These include: ? Influenza vaccine. Get this vaccine each year before the flu season begins. ? Pneumonia vaccine. ? Shingles vaccine. ? Tetanus, diphtheria, and pertussis (Tdap) booster vaccine. Your health care provider may also recommend other immunizations. Tell your health care provider if you have ever been abused or do not feel safe at home. Summary  Menopause is a normal process in which your ability to get pregnant comes to an end.  This condition causes hot flashes, night sweats, decreased interest in sex, mood swings, headaches, or lack of sleep.   Treatment for this condition may include hormone replacement therapy.  Take actions to keep yourself healthy, including exercising regularly, eating a healthy diet, watching your weight, and checking your blood pressure and blood sugar levels.  Get screened for cancer and depression. Make sure that you are up to date with all your vaccines. This information is not intended to replace advice given to you by your health care provider. Make sure you discuss any questions you have with your health care provider. Document Released: 10/13/2005 Document Revised: 08/14/2018 Document Reviewed: 08/14/2018 Elsevier Patient Education  2020 Elsevier Inc.  

## 2019-07-02 NOTE — Progress Notes (Signed)
Susan Hensley [birth year] [id number]    History:    Presents for annual exam.  2018 Mirena IUD removed was then amenorrheic for 1 year and then had bothersome hot flashes, poor sleep and irritability.  Was started on HRT and has now been having 1 to 2-day light bleeding every other month.  HRT has helped but states feels she needs a higher dose for persistent hot flashes.  Normal Pap and mammogram history.  2018 - colonoscopy.  2014 simple hyperplasia with irregular bleeding.  Past medical history, past surgical history, family history and social history were all reviewed and documented in the EPIC chart.  Working. Twins 13 daughter was hospitalized for anorexia last year but doing much better now,  continues in therapy.   ROS:  A ROS was performed and pertinent positives and negatives are included.  Exam:  Vitals:   07/02/19 1550  BP: 110/78  Weight: 132 lb (59.9 kg)  Height: 5\' 2"  (1.575 m)   Body mass index is 24.14 kg/m.   General appearance:  Normal Thyroid:  Symmetrical, normal in size, without palpable masses or nodularity. Respiratory  Auscultation:  Clear without wheezing or rhonchi Cardiovascular  Auscultation:  Regular rate, without rubs, murmurs or gallops  Edema/varicosities:  Not grossly evident Abdominal  Soft,nontender, without masses, guarding or rebound.  Liver/spleen:  No organomegaly noted  Hernia:  None appreciated  Skin  Inspection:  Grossly normal   Breasts: Examined lying and sitting.     Right: Without masses, retractions, discharge or axillary adenopathy.     Left: Without masses, retractions, discharge or axillary adenopathy. Gentitourinary   Inguinal/mons:  Normal without inguinal adenopathy  External genitalia:  Normal  BUS/Urethra/Skene's glands:  Normal  Vagina:  Normal  Cervix:  Normal  Uterus:  normal in size, shape and contour.  Midline and mobile  Adnexa/parametria:     Rt: Without masses or tenderness.   Lt: Without masses or  tenderness.  Anus and perineum: Normal  Digital rectal exam: Normal sphincter tone without palpated masses or tenderness  Assessment/Plan:  53 y.o. MWF G1, P2 for annual exam with continued hot flushes  1-2 light spotting every other month on HRT with continued hot flushes  Plan: Options reviewed we will try Prometrium 200 mg day 1 through 12 take at bedtime will have a scheduled withdrawal bleed, Vivelle patch 0.075 patch twice weekly, instructed to call if no relief in hot flushes.  HRT reviewed risks of blood clots, strokes and breast cancer reviewed.  SBEs, continue annual screening mammogram, calcium rich foods, vitamin D 2000 daily encouraged.  Reviewed importance of weightbearing and balance type exercise.  CBC, CMP, TSH, Pap normal 2019, new screening guidelines reviewed.  Reports normal lipid panel at primary care.    [place], 4:54 PM 07/02/2019

## 2019-07-04 ENCOUNTER — Encounter: Payer: Self-pay | Admitting: Women's Health

## 2019-07-04 LAB — CBC WITH DIFFERENTIAL/PLATELET
Absolute Monocytes: 462 cells/uL (ref 200–950)
Basophils Absolute: 43 cells/uL (ref 0–200)
Basophils Relative: 0.6 %
Eosinophils Absolute: 92 cells/uL (ref 15–500)
Eosinophils Relative: 1.3 %
HCT: 42.7 % (ref 35.0–45.0)
Hemoglobin: 14.1 g/dL (ref 11.7–15.5)
Lymphs Abs: 1953 cells/uL (ref 850–3900)
MCH: 30 pg (ref 27.0–33.0)
MCHC: 33 g/dL (ref 32.0–36.0)
MCV: 90.9 fL (ref 80.0–100.0)
MPV: 10 fL (ref 7.5–12.5)
Monocytes Relative: 6.5 %
Neutro Abs: 4551 cells/uL (ref 1500–7800)
Neutrophils Relative %: 64.1 %
Platelets: 354 10*3/uL (ref 140–400)
RBC: 4.7 10*6/uL (ref 3.80–5.10)
RDW: 11.6 % (ref 11.0–15.0)
Total Lymphocyte: 27.5 %
WBC: 7.1 10*3/uL (ref 3.8–10.8)

## 2019-07-04 LAB — COMPREHENSIVE METABOLIC PANEL
AG Ratio: 1.9 (calc) (ref 1.0–2.5)
ALT: 23 U/L (ref 6–29)
AST: 19 U/L (ref 10–35)
Albumin: 4.1 g/dL (ref 3.6–5.1)
Alkaline phosphatase (APISO): 49 U/L (ref 37–153)
BUN: 15 mg/dL (ref 7–25)
CO2: 28 mmol/L (ref 20–32)
Calcium: 9.5 mg/dL (ref 8.6–10.4)
Chloride: 103 mmol/L (ref 98–110)
Creat: 0.63 mg/dL (ref 0.50–1.05)
Globulin: 2.2 g/dL (calc) (ref 1.9–3.7)
Glucose, Bld: 78 mg/dL (ref 65–99)
Potassium: 4.5 mmol/L (ref 3.5–5.3)
Sodium: 138 mmol/L (ref 135–146)
Total Bilirubin: 0.4 mg/dL (ref 0.2–1.2)
Total Protein: 6.3 g/dL (ref 6.1–8.1)

## 2019-07-04 LAB — TSH: TSH: 1.82 mIU/L

## 2019-07-30 DIAGNOSIS — L578 Other skin changes due to chronic exposure to nonionizing radiation: Secondary | ICD-10-CM | POA: Diagnosis not present

## 2019-07-30 DIAGNOSIS — D235 Other benign neoplasm of skin of trunk: Secondary | ICD-10-CM | POA: Diagnosis not present

## 2019-07-30 DIAGNOSIS — B351 Tinea unguium: Secondary | ICD-10-CM | POA: Diagnosis not present

## 2019-07-30 DIAGNOSIS — D225 Melanocytic nevi of trunk: Secondary | ICD-10-CM | POA: Diagnosis not present

## 2019-09-17 IMAGING — MG DIGITAL SCREENING BILATERAL MAMMOGRAM WITH TOMO AND CAD
8 series · 9 of 24 positions shown · non-contrast
Comparison: Previous exam(s).

CLINICAL DATA: Screening.

EXAM:
DIGITAL SCREENING BILATERAL MAMMOGRAM WITH TOMO AND CAD

[L CC synth-2D]
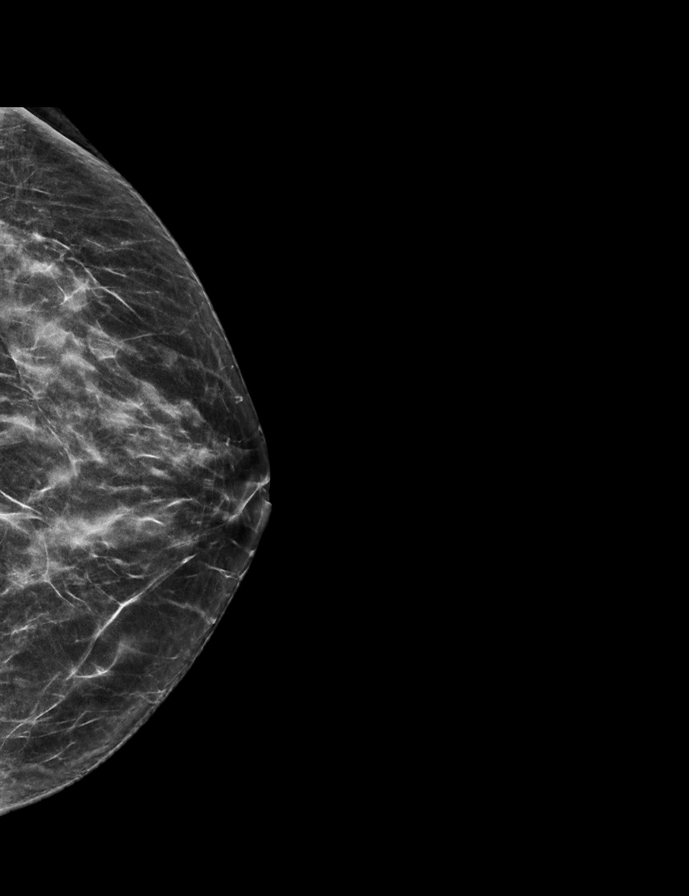

[L MLO synth-2D]
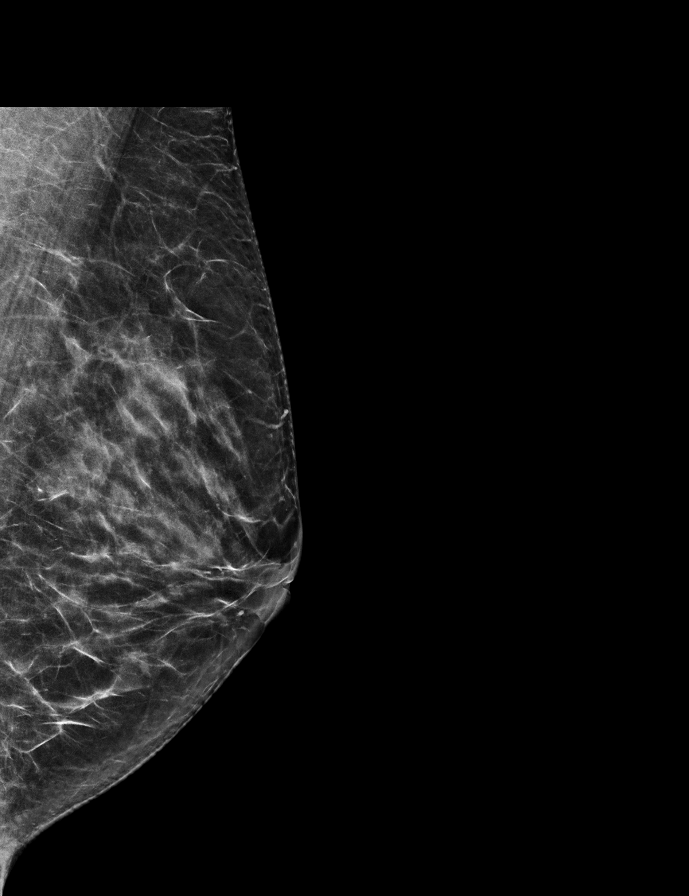

[R CC synth-2D]
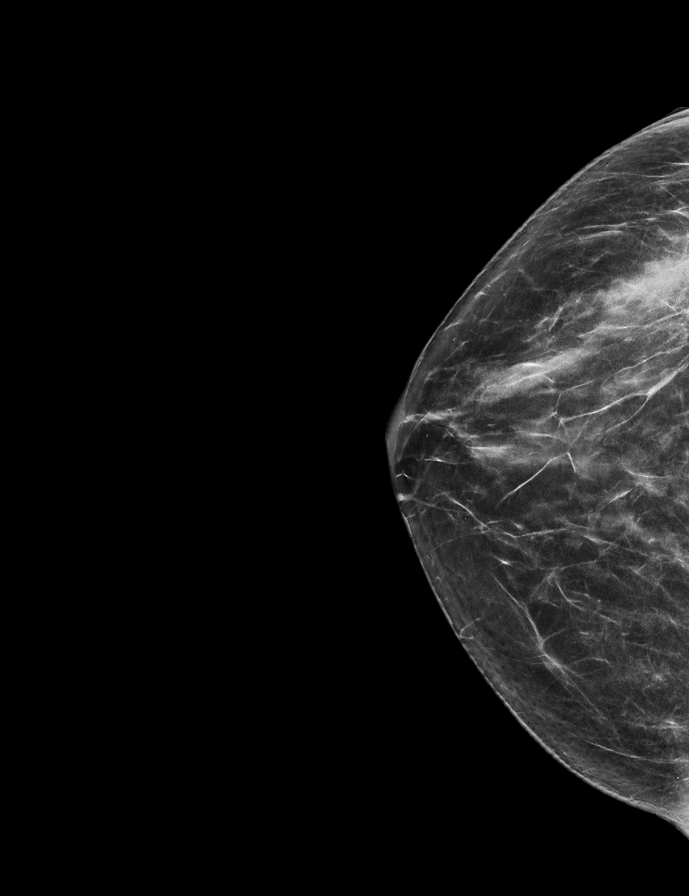

[R MLO synth-2D]
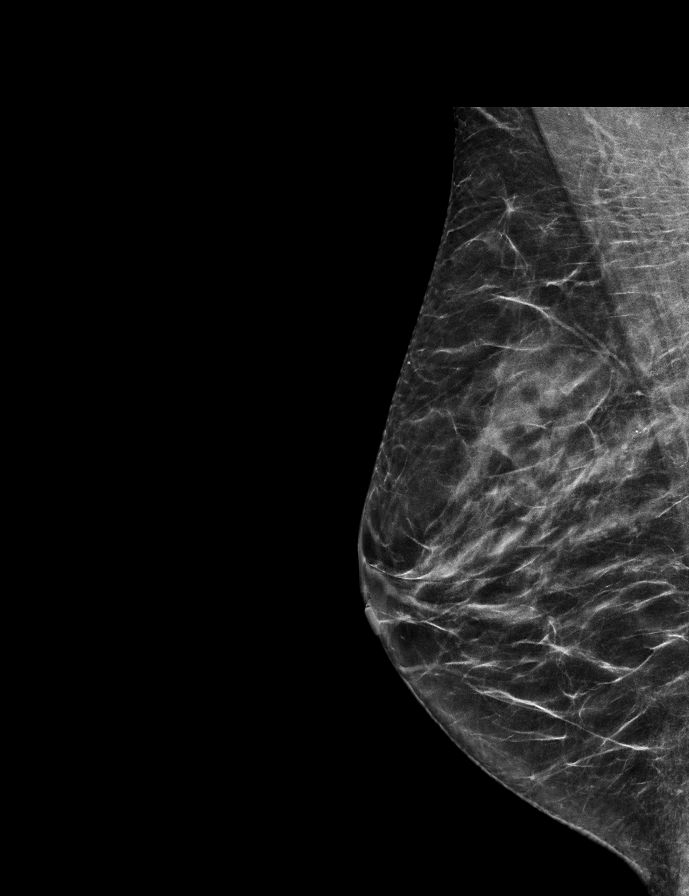

[R CC tomo · 2 of 64 frames shown]
[frame 21/64]
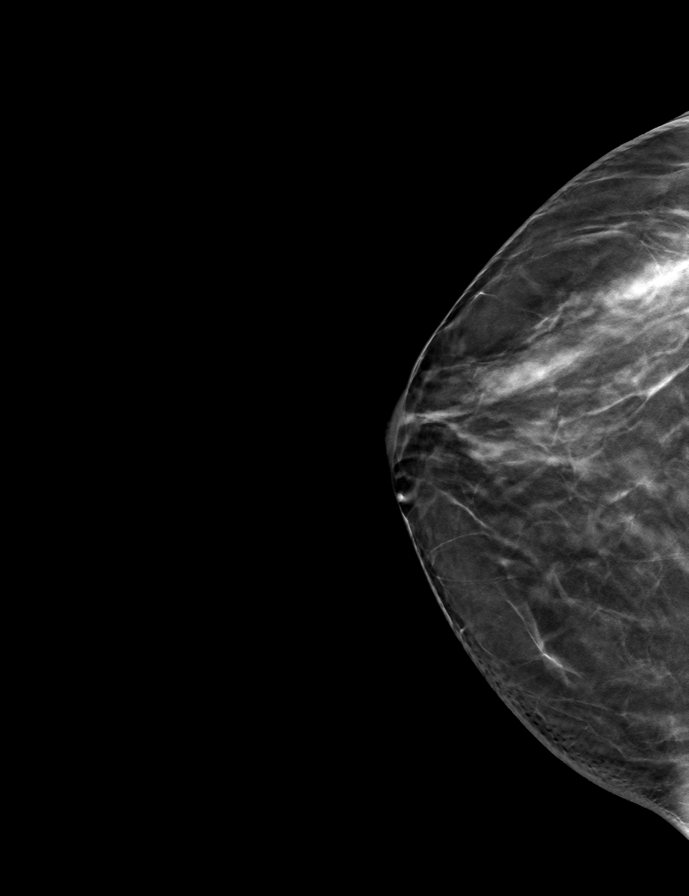
[frame 33/64]
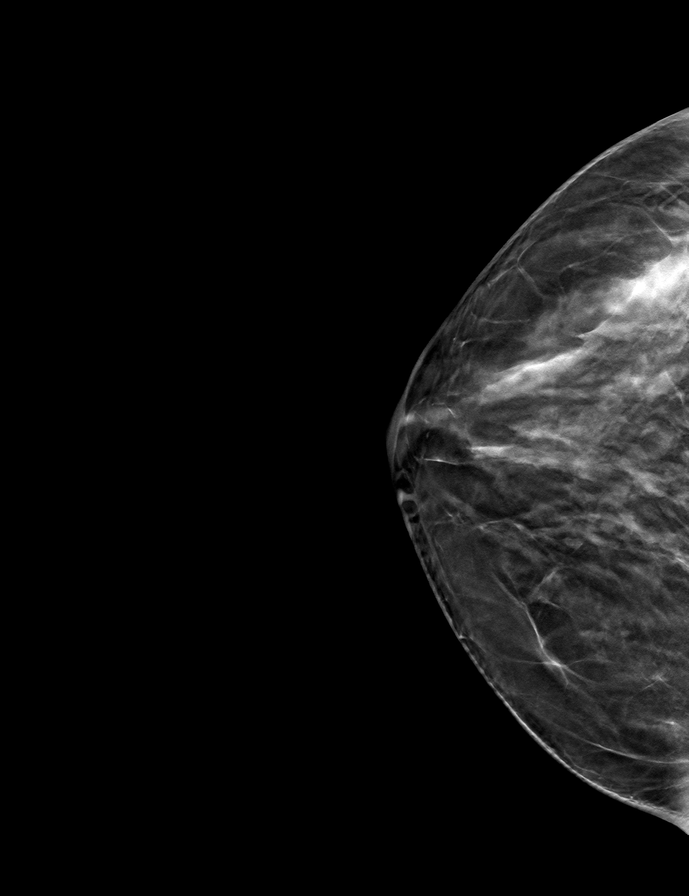

[R MLO tomo · tomo slice 31/62.0]
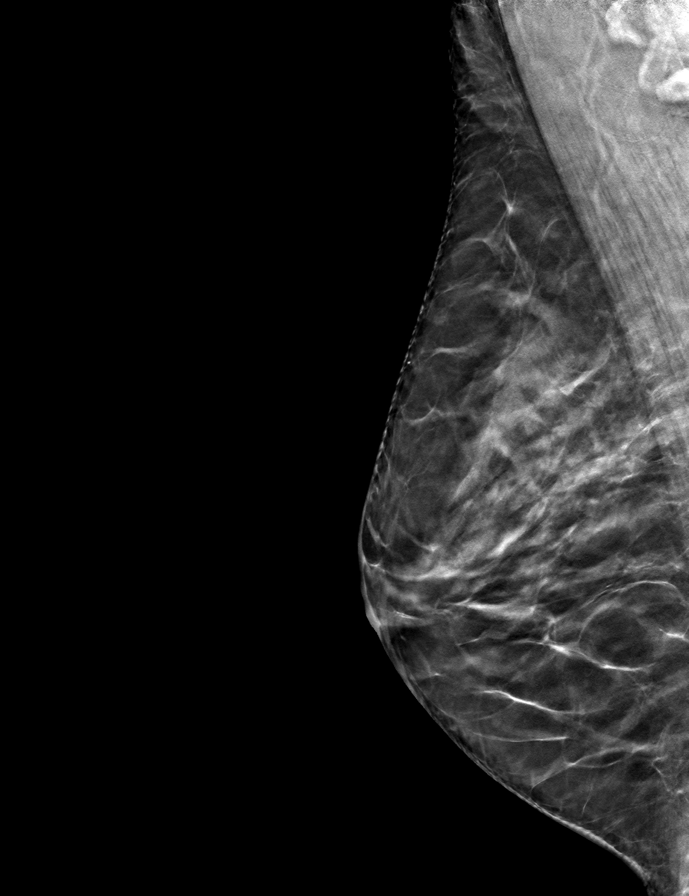

[L CC tomo · tomo slice 31/61.0]
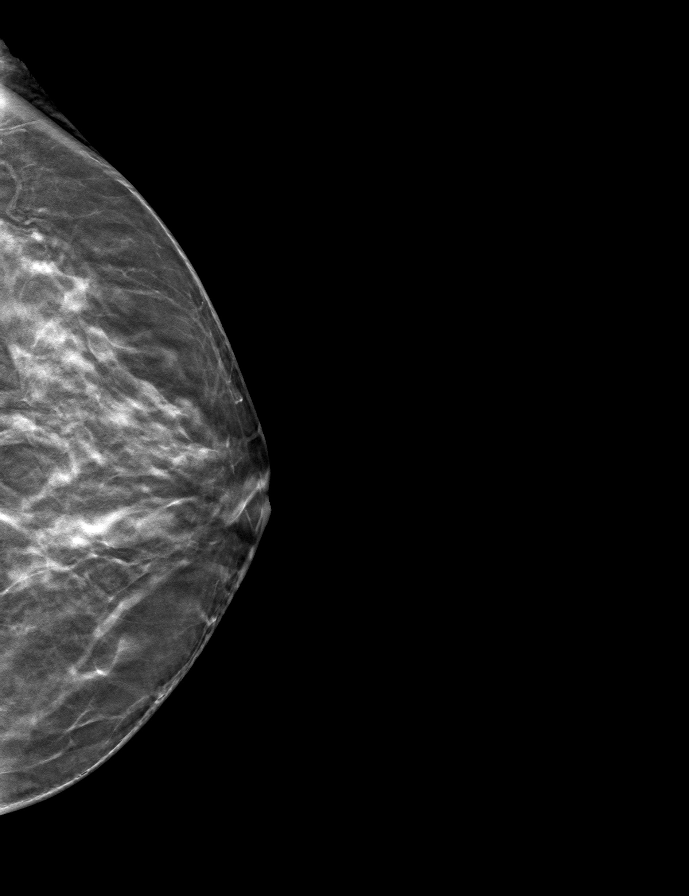

[L MLO tomo · tomo slice 31/62.0]
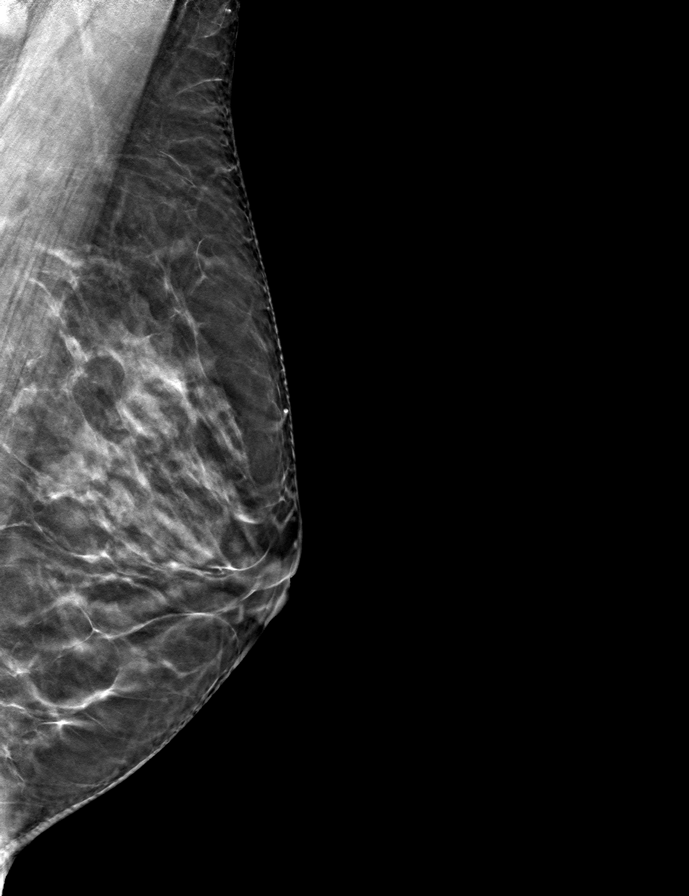

[9 of 24 positions shown; findings below may reference images not displayed]

ACR Breast Density Category c: The breast tissue is heterogeneously
dense, which may obscure small masses.
FINDINGS: There are no findings suspicious for malignancy. Images were
processed with CAD.
IMPRESSION: No mammographic evidence of malignancy. A result letter of this
screening mammogram will be mailed directly to the patient.

RECOMMENDATION:
Screening mammogram in one year. (Code:[id number]-U-LHB)

BI-RADS CATEGORY  1: Negative.

## 2019-11-17 DIAGNOSIS — L255 Unspecified contact dermatitis due to plants, except food: Secondary | ICD-10-CM | POA: Diagnosis not present

## 2020-01-14 ENCOUNTER — Other Ambulatory Visit: Payer: Self-pay | Admitting: Women's Health

## 2020-01-14 DIAGNOSIS — Z1231 Encounter for screening mammogram for malignant neoplasm of breast: Secondary | ICD-10-CM

## 2020-01-19 DIAGNOSIS — S51812A Laceration without foreign body of left forearm, initial encounter: Secondary | ICD-10-CM | POA: Diagnosis not present

## 2020-01-19 DIAGNOSIS — M25512 Pain in left shoulder: Secondary | ICD-10-CM | POA: Diagnosis not present

## 2020-01-19 DIAGNOSIS — S161XXA Strain of muscle, fascia and tendon at neck level, initial encounter: Secondary | ICD-10-CM | POA: Diagnosis not present

## 2020-01-19 DIAGNOSIS — M25532 Pain in left wrist: Secondary | ICD-10-CM | POA: Diagnosis not present

## 2020-01-19 DIAGNOSIS — S6992XA Unspecified injury of left wrist, hand and finger(s), initial encounter: Secondary | ICD-10-CM | POA: Diagnosis not present

## 2020-01-19 DIAGNOSIS — M542 Cervicalgia: Secondary | ICD-10-CM | POA: Diagnosis not present

## 2020-01-19 DIAGNOSIS — W109XXA Fall (on) (from) unspecified stairs and steps, initial encounter: Secondary | ICD-10-CM | POA: Diagnosis not present

## 2020-01-19 DIAGNOSIS — S199XXA Unspecified injury of neck, initial encounter: Secondary | ICD-10-CM | POA: Diagnosis not present

## 2020-01-19 DIAGNOSIS — S59902A Unspecified injury of left elbow, initial encounter: Secondary | ICD-10-CM | POA: Diagnosis not present

## 2020-01-19 DIAGNOSIS — S4992XA Unspecified injury of left shoulder and upper arm, initial encounter: Secondary | ICD-10-CM | POA: Diagnosis not present

## 2020-01-23 ENCOUNTER — Other Ambulatory Visit: Payer: Self-pay | Admitting: Obstetrics & Gynecology

## 2020-01-23 DIAGNOSIS — Z1231 Encounter for screening mammogram for malignant neoplasm of breast: Secondary | ICD-10-CM

## 2020-02-03 DIAGNOSIS — G44209 Tension-type headache, unspecified, not intractable: Secondary | ICD-10-CM | POA: Diagnosis not present

## 2020-02-03 DIAGNOSIS — R519 Headache, unspecified: Secondary | ICD-10-CM | POA: Diagnosis not present

## 2020-07-07 ENCOUNTER — Telehealth: Payer: Self-pay | Admitting: *Deleted

## 2020-07-07 MED ORDER — ESTRADIOL 0.075 MG/24HR TD PTTW: 1.0000 | MEDICATED_PATCH | TRANSDERMAL | 0 refills | Status: DC

## 2020-07-07 NOTE — Telephone Encounter (Signed)
Patient has annual exam scheduled on 07/08/20 needs refills on estradiol patch. Rx sent.

## 2020-07-08 ENCOUNTER — Encounter: Payer: Self-pay | Admitting: Nurse Practitioner

## 2020-07-08 ENCOUNTER — Ambulatory Visit (INDEPENDENT_AMBULATORY_CARE_PROVIDER_SITE_OTHER): Payer: BC Managed Care – PPO | Admitting: Nurse Practitioner

## 2020-07-08 ENCOUNTER — Other Ambulatory Visit: Payer: Self-pay

## 2020-07-08 VITALS — BP 110/80 | Ht 62.0 in | Wt 130.0 lb

## 2020-07-08 DIAGNOSIS — E785 Hyperlipidemia, unspecified: Secondary | ICD-10-CM | POA: Diagnosis not present

## 2020-07-08 DIAGNOSIS — N951 Menopausal and female climacteric states: Secondary | ICD-10-CM

## 2020-07-08 DIAGNOSIS — Z01419 Encounter for gynecological examination (general) (routine) without abnormal findings: Secondary | ICD-10-CM

## 2020-07-08 DIAGNOSIS — N8501 Benign endometrial hyperplasia: Secondary | ICD-10-CM | POA: Diagnosis not present

## 2020-07-08 DIAGNOSIS — Z79890 Hormone replacement therapy (postmenopausal): Secondary | ICD-10-CM | POA: Diagnosis not present

## 2020-07-08 LAB — CBC WITH DIFFERENTIAL/PLATELET
Hemoglobin: 14.2 g/dL (ref 11.7–15.5)
Lymphs Abs: 1214 cells/uL (ref 850–3900)
MCHC: 33.3 g/dL (ref 32.0–36.0)

## 2020-07-08 MED ORDER — ESTRADIOL 0.075 MG/24HR TD PTTW: 1.0000 | MEDICATED_PATCH | TRANSDERMAL | 4 refills | Status: DC

## 2020-07-08 MED ORDER — PROGESTERONE 200 MG PO CAPS: 200.0000 mg | ORAL_CAPSULE | Freq: Every day | ORAL | 4 refills | Status: DC

## 2020-07-08 NOTE — Progress Notes (Signed)
   Susan Hensley 04-19-1966 [id number]   History:  54 y.o. G2P0012 presents for annual exam. Normal pap and mammogram history. 2014 simple endometrial hyperplasia. On cyclic HRT with monthly withdrawal bleeding. Complains of vaginal dryness and dyspareunia. Using OTC oil-based lubricant.   Gynecologic History Patient's last menstrual period was 06/30/2020. Period Cycle (Days): 28 Period Duration (Days): 2 Period Pattern: Regular Menstrual Flow: Moderate Menstrual Control: Maxi pad, Tampon Dysmenorrhea: (!) Mild Dysmenorrhea Symptoms: Cramping Contraception: none Last Pap: 06/18/2018. Results were: normal Last mammogram: 04/2018. Results were: normal Last colonoscopy: 07/2018. Normal: normal   Past medical history, past surgical history, family history and social history were all reviewed and documented in the EPIC chart.  ROS:  A ROS was performed and pertinent positives and negatives are included.  Exam:  Vitals:   07/08/20 0818  BP: 110/80  Weight: 130 lb (59 kg)  Height: 5\' 2"  (1.575 m)   Body mass index is 23.78 kg/m.  General appearance:  Normal Thyroid:  Symmetrical, normal in size, without palpable masses or nodularity. Respiratory  Auscultation:  Clear without wheezing or rhonchi Cardiovascular  Auscultation:  Regular rate, without rubs, murmurs or gallops  Edema/varicosities:  Not grossly evident Abdominal  Soft,nontender, without masses, guarding or rebound.  Liver/spleen:  No organomegaly noted  Hernia:  None appreciated  Skin  Inspection:  Grossly normal   Breasts: Examined lying and sitting.   Right: Without masses, retractions, discharge or axillary adenopathy.   Left: Without masses, retractions, discharge or axillary adenopathy. Gentitourinary   Inguinal/mons:  Normal without inguinal adenopathy  External genitalia:  Normal  BUS/Urethra/Skene's glands:  Normal  Vagina:  Atrophic changes  Cervix:  Normal  Uterus:  Normal in size, shape and  contour.  Midline and mobile  Adnexa/parametria:     Rt: Without masses or tenderness.   Lt: Without masses or tenderness.  Anus and perineum: Normal  Digital rectal exam: Normal sphincter tone without palpated masses or tenderness  Assessment/Plan:  54 y.o. 57 for annual exam.   Well female exam with routine gynecological exam - Plan: CBC with Differential/Platelet, Comprehensive metabolic panel, Lipid panel. Education provided on SBEs, importance of preventative screenings, current guidelines, high calcium diet, regular exercise, and multivitamin daily.   Hormone replacement therapy (HRT) - Plan: estradiol (VIVELLE-DOT) 0.075 MG/24HR, progesterone (PROMETRIUM) 200 MG capsule. Good management of hot flashes and mood changes. Taking Prometrium days 1-12 with monthly withdrawal bleeding.  She is aware of the slight risk for blood clots, heart attack, stroke and breast cancer.  She would like to continue.  Refill x1 year provided.  Simple endometrial hyperplasia - 2014  Menopausal vaginal dryness -using OTC oil-based lubricant.  Recommend Replens 2-3 times per week and coconut oil with intercourse.  Screening for cervical cancer -normal Pap history.  Discussed current guidelines but she would like a Pap today.  Pap IG w/ reflex to HPV when [facility]  Screening for breast cancer -normal mammogram history.  She is overdue for mammogram.  Discussed current guidelines and importance of preventative screenings.  Recommend scheduling this soon.  Normal breast exam today.  Screening for colon cancer -normal colonoscopy in 2019.  She will repeat screening at GI's recommended interval.  Follow-up in 1 year for annual.       2020 [facility], 8:34 AM 07/08/2020

## 2020-07-08 NOTE — Patient Instructions (Addendum)
Replens & coconut oil    Health Maintenance, Female Adopting a healthy lifestyle and getting preventive care are important in promoting health and wellness. Ask your health care provider about:  The right schedule for you to have regular tests and exams.  Things you can do on your own to prevent diseases and keep yourself healthy. What should I know about diet, weight, and exercise? Eat a healthy diet   Eat a diet that includes plenty of vegetables, fruits, low-fat dairy products, and lean protein.  Do not eat a lot of foods that are high in solid fats, added sugars, or sodium. Maintain a healthy weight Body mass index (BMI) is used to identify weight problems. It estimates body fat based on height and weight. Your health care provider can help determine your BMI and help you achieve or maintain a healthy weight. Get regular exercise Get regular exercise. This is one of the most important things you can do for your health. Most adults should:  Exercise for at least 150 minutes each week. The exercise should increase your heart rate and make you sweat (moderate-intensity exercise).  Do strengthening exercises at least twice a week. This is in addition to the moderate-intensity exercise.  Spend less time sitting. Even light physical activity can be beneficial. Watch cholesterol and blood lipids Have your blood tested for lipids and cholesterol at 54 years of age, then have this test every 5 years. Have your cholesterol levels checked more often if:  Your lipid or cholesterol levels are high.  You are older than 54 years of age.  You are at high risk for heart disease. What should I know about cancer screening? Depending on your health history and family history, you may need to have cancer screening at various ages. This may include screening for:  Breast cancer.  Cervical cancer.  Colorectal cancer.  Skin cancer.  Lung cancer. What should I know about heart disease,  diabetes, and high blood pressure? Blood pressure and heart disease  High blood pressure causes heart disease and increases the risk of stroke. This is more likely to develop in people who have high blood pressure readings, are of African descent, or are overweight.  Have your blood pressure checked: ? Every 3-5 years if you are 65-106 years of age. ? Every year if you are 26 years old or older. Diabetes Have regular diabetes screenings. This checks your fasting blood sugar level. Have the screening done:  Once every three years after age 67 if you are at a normal weight and have a low risk for diabetes.  More often and at a younger age if you are overweight or have a high risk for diabetes. What should I know about preventing infection? Hepatitis B If you have a higher risk for hepatitis B, you should be screened for this virus. Talk with your health care provider to find out if you are at risk for hepatitis B infection. Hepatitis C Testing is recommended for:  Everyone born from [birth year] through 1965.  Anyone with known risk factors for hepatitis C. Sexually transmitted infections (STIs)  Get screened for STIs, including gonorrhea and chlamydia, if: ? You are sexually active and are younger than 54 years of age. ? You are older than 54 years of age and your health care provider tells you that you are at risk for this type of infection. ? Your sexual activity has changed since you were last screened, and you are at increased risk for chlamydia or  gonorrhea. Ask your health care provider if you are at risk.  Ask your health care provider about whether you are at high risk for HIV. Your health care provider may recommend a prescription medicine to help prevent HIV infection. If you choose to take medicine to prevent HIV, you should first get tested for HIV. You should then be tested every 3 months for as long as you are taking the medicine. Pregnancy  If you are about to stop having your  period (premenopausal) and you may become pregnant, seek counseling before you get pregnant.  Take 400 to 800 micrograms (mcg) of folic acid every day if you become pregnant.  Ask for birth control (contraception) if you want to prevent pregnancy. Osteoporosis and menopause Osteoporosis is a disease in which the bones lose minerals and strength with aging. This can result in bone fractures. If you are 65 years old or older, or if you are at risk for osteoporosis and fractures, ask your health care provider if you should:  Be screened for bone loss.  Take a calcium or vitamin D supplement to lower your risk of fractures.  Be given hormone replacement therapy (HRT) to treat symptoms of menopause. Follow these instructions at home: Lifestyle  Do not use any products that contain nicotine or tobacco, such as cigarettes, e-cigarettes, and chewing tobacco. If you need help quitting, ask your health care provider.  Do not use street drugs.  Do not share needles.  Ask your health care provider for help if you need support or information about quitting drugs. Alcohol use  Do not drink alcohol if: ? Your health care provider tells you not to drink. ? You are pregnant, may be pregnant, or are planning to become pregnant.  If you drink alcohol: ? Limit how much you use to 0-1 drink a day. ? Limit intake if you are breastfeeding.  Be aware of how much alcohol is in your drink. In the [place] one drink equals one 12 oz bottle of beer (355 mL), one 5 oz glass of wine (148 mL), or one 1 oz glass of hard liquor (44 mL). General instructions  Schedule regular health, dental, and eye exams.  Stay current with your vaccines.  Tell your health care provider if: ? You often feel depressed. ? You have ever been abused or do not feel safe at home. Summary  Adopting a healthy lifestyle and getting preventive care are important in promoting health and wellness.  Follow your health care provider's  instructions about healthy diet, exercising, and getting tested or screened for diseases.  Follow your health care provider's instructions on monitoring your cholesterol and blood pressure. This information is not intended to replace advice given to you by your health care provider. Make sure you discuss any questions you have with your health care provider. Document Revised: 08/14/2018 Document Reviewed: 08/14/2018 Elsevier Patient Education  2020 [employer]  

## 2020-07-09 LAB — COMPREHENSIVE METABOLIC PANEL WITH GFR
AG Ratio: 1.9 (calc) (ref 1.0–2.5)
ALT: 22 U/L (ref 6–29)
AST: 20 U/L (ref 10–35)
Albumin: 4.2 g/dL (ref 3.6–5.1)
Alkaline phosphatase (APISO): 57 U/L (ref 37–153)
BUN: 14 mg/dL (ref 7–25)
CO2: 28 mmol/L (ref 20–32)
Calcium: 9.7 mg/dL (ref 8.6–10.4)
Chloride: 103 mmol/L (ref 98–110)
Creat: 0.63 mg/dL (ref 0.50–1.05)
Globulin: 2.2 g/dL (ref 1.9–3.7)
Glucose, Bld: 82 mg/dL (ref 65–99)
Potassium: 4.5 mmol/L (ref 3.5–5.3)
Sodium: 139 mmol/L (ref 135–146)
Total Bilirubin: 0.4 mg/dL (ref 0.2–1.2)
Total Protein: 6.4 g/dL (ref 6.1–8.1)

## 2020-07-09 LAB — LIPID PANEL
Cholesterol: 180 mg/dL
HDL: 65 mg/dL
LDL Cholesterol (Calc): 102 mg/dL — ABNORMAL HIGH
Non-HDL Cholesterol (Calc): 115 mg/dL
Total CHOL/HDL Ratio: 2.8 (calc)
Triglycerides: 50 mg/dL

## 2020-07-09 LAB — PAP IG W/ RFLX HPV ASCU

## 2020-07-09 LAB — CBC WITH DIFFERENTIAL/PLATELET
Absolute Monocytes: 343 cells/uL (ref 200–950)
Basophils Absolute: 22 cells/uL (ref 0–200)
Basophils Relative: 0.5 %
Eosinophils Absolute: 110 cells/uL (ref 15–500)
Eosinophils Relative: 2.5 %
HCT: 42.7 % (ref 35.0–45.0)
MCH: 28.9 pg (ref 27.0–33.0)
MCV: 87 fL (ref 80.0–100.0)
MPV: 10.1 fL (ref 7.5–12.5)
Monocytes Relative: 7.8 %
Neutro Abs: 2710 cells/uL (ref 1500–7800)
Neutrophils Relative %: 61.6 %
Platelets: 338 10*3/uL (ref 140–400)
RBC: 4.91 10*6/uL (ref 3.80–5.10)
RDW: 12.3 % (ref 11.0–15.0)
Total Lymphocyte: 27.6 %
WBC: 4.4 10*3/uL (ref 3.8–10.8)

## 2020-07-13 DIAGNOSIS — H524 Presbyopia: Secondary | ICD-10-CM | POA: Diagnosis not present

## 2020-12-20 ENCOUNTER — Other Ambulatory Visit: Payer: Self-pay | Admitting: Nurse Practitioner

## 2020-12-20 DIAGNOSIS — Z79890 Hormone replacement therapy (postmenopausal): Secondary | ICD-10-CM

## 2021-01-18 DIAGNOSIS — D225 Melanocytic nevi of trunk: Secondary | ICD-10-CM | POA: Diagnosis not present

## 2021-01-18 DIAGNOSIS — B351 Tinea unguium: Secondary | ICD-10-CM | POA: Diagnosis not present

## 2021-01-18 DIAGNOSIS — L814 Other melanin hyperpigmentation: Secondary | ICD-10-CM | POA: Diagnosis not present

## 2021-01-18 DIAGNOSIS — D2239 Melanocytic nevi of other parts of face: Secondary | ICD-10-CM | POA: Diagnosis not present

## 2021-01-28 DIAGNOSIS — J309 Allergic rhinitis, unspecified: Secondary | ICD-10-CM | POA: Diagnosis not present

## 2021-02-25 ENCOUNTER — Telehealth: Payer: Self-pay | Admitting: *Deleted

## 2021-02-25 NOTE — Telephone Encounter (Signed)
Pt called complaining of vaginal bleeding/irregular menstrual cycle no pain. Message sent to appointment desk pool to call and schedule appointment. Patient aware.

## 2021-03-01 ENCOUNTER — Encounter: Payer: Self-pay | Admitting: Obstetrics and Gynecology

## 2021-03-01 ENCOUNTER — Other Ambulatory Visit: Payer: Self-pay

## 2021-03-01 ENCOUNTER — Other Ambulatory Visit (HOSPITAL_COMMUNITY)
Admission: RE | Admit: 2021-03-01 | Discharge: 2021-03-01 | Disposition: A | Discharge status: Home / Self Care | Payer: BC Managed Care – PPO | Source: Ambulatory Visit | Attending: Obstetrics and Gynecology | Admitting: Obstetrics and Gynecology

## 2021-03-01 ENCOUNTER — Other Ambulatory Visit: Payer: Self-pay | Admitting: Obstetrics and Gynecology

## 2021-03-01 ENCOUNTER — Ambulatory Visit: Payer: BC Managed Care – PPO | Admitting: Obstetrics and Gynecology

## 2021-03-01 VITALS — BP 110/68 | HR 70 | Ht 62.0 in | Wt 115.0 lb

## 2021-03-01 DIAGNOSIS — N939 Abnormal uterine and vaginal bleeding, unspecified: Secondary | ICD-10-CM

## 2021-03-01 DIAGNOSIS — Z8742 Personal history of other diseases of the female genital tract: Secondary | ICD-10-CM

## 2021-03-01 DIAGNOSIS — N841 Polyp of cervix uteri: Secondary | ICD-10-CM

## 2021-03-01 DIAGNOSIS — Z1231 Encounter for screening mammogram for malignant neoplasm of breast: Secondary | ICD-10-CM

## 2021-03-01 DIAGNOSIS — N879 Dysplasia of cervix uteri, unspecified: Secondary | ICD-10-CM | POA: Diagnosis not present

## 2021-03-01 NOTE — Progress Notes (Signed)
GYNECOLOGY  VISIT   HPI: 55 y.o.   Married female   [phone] with Patient's last menstrual period was 02/13/2021 (exact date).   here for irregular vaginal bleeding.  Patient is using cyclic HRT.  Menses occurring every 28 - 33 days.  Last time occurred after 18 days, lasted 14 days.  Now brown blood.  Having some cramping.   The hormone therapy treats hot flashes and helps her mood.   Patch is not peeling off.  Last month, lost track of when to take the Prometrium.   Likes the rhythm of having her cycle.  Usually lasts 3 days.  Periods never stopped other than when she had Mirena.  Hx simple endometrial hyperplasia in 2013 at [place].  Used Mirena in the past.   Lost 18 - 20 pounds since January, 2022.   GYNECOLOGIC HISTORY: Patient's last menstrual period was 02/13/2021 (exact date). Contraception: Vasectomy Menopausal hormone therapy: Vivelle Dot and Prometrium 200mg  Last mammogram:  06-03-18 3D/Neg/BiRads1 Last pap smear: 07-08-20 Neg, 06-18-18 Neg:Neg HR HPV, 05-29-17 Neg:Neg HR HPV        OB History     Gravida  2   Para      Term      Preterm      AB  1   Living  2      SAB  1   IAB      Ectopic      Multiple      Live Births                 Patient Active Problem List   Diagnosis Date Noted   Simple endometrial hyperplasia 05/29/2017    History reviewed. No pertinent past medical history.  Past Surgical History:  Procedure Laterality Date   DILATION AND CURETTAGE OF UTERUS     FOOT SURGERY Bilateral 1986    Current Outpatient Medications  Medication Sig Dispense Refill   estradiol (VIVELLE-DOT) 0.075 MG/24HR Place 1 patch onto the skin 2 (two) times a week. 24 patch 4   progesterone (PROMETRIUM) 200 MG capsule Take 1 capsule (200 mg total) by mouth daily. 36 capsule 4   No current facility-administered medications for this visit.     ALLERGIES: Patient has no known allergies.  Family History  Problem Relation Age of Onset    Diabetes Maternal Grandmother    Heart failure Maternal Grandfather    Breast cancer Neg Hx     Social History   Socioeconomic History   Marital status: Married    Spouse name: Not on file   Number of children: Not on file   Years of education: Not on file   Highest education level: Not on file  Occupational History   Not on file  Tobacco Use   Smoking status: Never   Smokeless tobacco: Never  Vaping Use   Vaping Use: Never used  Substance and Sexual Activity   Alcohol use: Yes    Alcohol/week: 2.0 standard drinks    Types: 2 Glasses of wine per week   Drug use: No   Sexual activity: Yes    Comment: INTERCOURSE AGE 17, SEXUAL PARTNERS LESS THAN 5  Other Topics Concern   Not on file  Social History Narrative   Not on file   Social Determinants of Health   Financial Resource Strain: Not on file  Food Insecurity: Not on file  Transportation Needs: Not on file  Physical Activity: Not on file  Stress: Not on file  Social Connections: Not on file  Intimate Partner Violence: Not on file    Review of Systems  All other systems reviewed and are negative.  PHYSICAL EXAMINATION:    BP 110/68   Pulse 70   Ht 5\' 2"  (1.575 m)   Wt 115 lb (52.2 kg)   LMP 02/13/2021 (Exact Date)   SpO2 100%   BMI 21.03 kg/m     General appearance: alert, cooperative and appears stated age   Pelvic: External genitalia:  no lesions              Urethra:  normal appearing urethra with no masses, tenderness or lesions              Bartholins and Skenes: normal                 Vagina: normal appearing vagina with normal color and discharge, no lesions              Cervix: cervical polyp noted and removed with ring forceps after verbal permission given by patient.  Tissue to pathology.                Bimanual Exam:  Uterus:  normal size, contour, position, consistency, mobility, non-tender              Adnexa: no mass, fullness, tenderness              Chaperone was present for exam:   04/15/2021, CMA.  ASSESSMENT  Abnormal uterine bleeding.  Cyclic HRT.  Hx simple endometrial hyperplasia without atypia. Cervical polyp.  PLAN  We discussed abnormal uterine bleeding and etiologies - change in HRT, polyps, fibroids, hyperplasia, abnormal cells.  Cervical polyp to pathology. Return for pelvic Marchelle Folks and possible endometrial biopsy.  She will update her mammogram.   30 min total time was spent for this patient encounter, including preparation, face-to-face counseling with the patient, coordination of care, and documentation of the encounter.

## 2021-03-03 LAB — SURGICAL PATHOLOGY

## 2021-03-24 ENCOUNTER — Encounter: Payer: Self-pay | Admitting: Obstetrics and Gynecology

## 2021-03-24 ENCOUNTER — Other Ambulatory Visit: Payer: Self-pay

## 2021-03-24 ENCOUNTER — Ambulatory Visit: Payer: BC Managed Care – PPO | Admitting: Obstetrics and Gynecology

## 2021-03-24 ENCOUNTER — Ambulatory Visit (INDEPENDENT_AMBULATORY_CARE_PROVIDER_SITE_OTHER): Payer: BC Managed Care – PPO

## 2021-03-24 VITALS — BP 100/60 | Ht 62.0 in | Wt 115.0 lb

## 2021-03-24 DIAGNOSIS — N939 Abnormal uterine and vaginal bleeding, unspecified: Secondary | ICD-10-CM | POA: Diagnosis not present

## 2021-03-24 DIAGNOSIS — Z8742 Personal history of other diseases of the female genital tract: Secondary | ICD-10-CM

## 2021-03-24 DIAGNOSIS — Z79890 Hormone replacement therapy (postmenopausal): Secondary | ICD-10-CM | POA: Diagnosis not present

## 2021-03-24 NOTE — Progress Notes (Signed)
GYNECOLOGY  VISIT   HPI: 55 y.o.   Married  female   [id number] with No LMP recorded.   here for pelvic ultrasound and EMB.  Patient having irregular bleeding on cyclic HRT.  Her LMP was 02/13/21.   Bleeding stopped just after she was here on 03/01/21.  States she almost cancelled the appointment today.  Lost track in May when to take the Prometrium.   Hx simple endometrial hyperplasia in 2013 at [place]. Used Mirena in the past. Never stopped having vaginal bleeding except when she used the Mirena.  FSH 33.5 on 03/15/16.   Her HRT treats her hot flashes.    Lost 18 - 20 pounds since January, 2022.   Her twins are 55 yo today.   GYNECOLOGIC HISTORY: No LMP recorded. Contraception:  Vasectomy Menopausal hormone therapy: Vivelle Dot and Prometrium 200mg  Last mammogram: 06-03-18 3D/Neg/BiRads1--appt. 04-25-21 Last pap smear: 07-08-20 Neg, 06-18-18 Neg:Neg HR HPV, 05-29-17 Neg:Neg HR HPV        OB History     Gravida  2   Para      Term      Preterm      AB  1   Living  2      SAB  1   IAB      Ectopic      Multiple      Live Births                 Patient Active Problem List   Diagnosis Date Noted   Simple endometrial hyperplasia 05/29/2017    History reviewed. No pertinent past medical history.  Past Surgical History:  Procedure Laterality Date   DILATION AND CURETTAGE OF UTERUS     FOOT SURGERY Bilateral 1986    Current Outpatient Medications  Medication Sig Dispense Refill   estradiol (VIVELLE-DOT) 0.075 MG/24HR Place 1 patch onto the skin 2 (two) times a week. 24 patch 4   progesterone (PROMETRIUM) 200 MG capsule Take 1 capsule (200 mg total) by mouth daily. 36 capsule 4   No current facility-administered medications for this visit.     ALLERGIES: Patient has no known allergies.  Family History  Problem Relation Age of Onset   Diabetes Maternal Grandmother    Heart failure Maternal Grandfather    Breast cancer Neg Hx     Social History    Socioeconomic History   Marital status: Married    Spouse name: Not on file   Number of children: Not on file   Years of education: Not on file   Highest education level: Not on file  Occupational History   Not on file  Tobacco Use   Smoking status: Never   Smokeless tobacco: Never  Vaping Use   Vaping Use: Never used  Substance and Sexual Activity   Alcohol use: Yes    Alcohol/week: 2.0 standard drinks    Types: 2 Glasses of wine per week   Drug use: No   Sexual activity: Yes    Comment: INTERCOURSE AGE 9, SEXUAL PARTNERS LESS THAN 5  Other Topics Concern   Not on file  Social History Narrative   Not on file   Social Determinants of Health   Financial Resource Strain: Not on file  Food Insecurity: Not on file  Transportation Needs: Not on file  Physical Activity: Not on file  Stress: Not on file  Social Connections: Not on file  Intimate Partner Violence: Not on file    Review of Systems  All other systems reviewed and are negative.  PHYSICAL EXAMINATION:    BP 100/60   Ht 5\' 2"  (1.575 m)   Wt 115 lb (52.2 kg)   BMI 21.03 kg/m     General appearance: alert, cooperative and appears stated age  Pelvic Uterus 7.49 x 4.78 x 3.69 cm.  EMS 2.42 mm.  Ovaries normal.  No adnexal masses.  No free fluid.   ASSESSMENT  Abnormal uterine bleeding, resolved.  Hx simple hyperplasia. Cyclic HRT.  PLAN  Pelvic ultrasound report and images reviewed with the patient. She will update her mammogram and understands the importance of this for breast cancer screening and because her HRT does increase the risk of breast cancer.  No EMB at this time.  She will continue cyclic HRT.  We discussed the option of continuous HRT.   Both continuous and cyclic HRT protect her endometrium.  She will return for heavy, frequent or prolonged bleeding.  If this occurs, I would recommend an endometrial biopsy. FU for routine annual exam and prn.   An After Visit Summary was  printed and given to the patient.  21 min  total time was spent for this patient encounter, including preparation, face-to-face counseling with the patient, coordination of care, and documentation of the encounter.

## 2021-04-25 ENCOUNTER — Ambulatory Visit
Admission: RE | Admit: 2021-04-25 | Discharge: 2021-04-25 | Disposition: A | Discharge status: Home / Self Care | Payer: BC Managed Care – PPO | Source: Ambulatory Visit | Attending: Obstetrics and Gynecology | Admitting: Obstetrics and Gynecology

## 2021-04-25 ENCOUNTER — Other Ambulatory Visit: Payer: Self-pay

## 2021-04-25 DIAGNOSIS — Z1231 Encounter for screening mammogram for malignant neoplasm of breast: Secondary | ICD-10-CM

## 2021-07-14 ENCOUNTER — Ambulatory Visit: Payer: Self-pay | Admitting: Nurse Practitioner

## 2021-08-09 ENCOUNTER — Encounter: Payer: Self-pay | Admitting: Nurse Practitioner

## 2021-08-09 ENCOUNTER — Other Ambulatory Visit: Payer: Self-pay

## 2021-08-09 ENCOUNTER — Ambulatory Visit (INDEPENDENT_AMBULATORY_CARE_PROVIDER_SITE_OTHER): Payer: BC Managed Care – PPO | Admitting: Nurse Practitioner

## 2021-08-09 VITALS — BP 120/76 | Ht 61.5 in | Wt 110.0 lb

## 2021-08-09 DIAGNOSIS — E7800 Pure hypercholesterolemia, unspecified: Secondary | ICD-10-CM

## 2021-08-09 DIAGNOSIS — Z01419 Encounter for gynecological examination (general) (routine) without abnormal findings: Secondary | ICD-10-CM

## 2021-08-09 DIAGNOSIS — Z8639 Personal history of other endocrine, nutritional and metabolic disease: Secondary | ICD-10-CM | POA: Diagnosis not present

## 2021-08-09 DIAGNOSIS — Z79890 Hormone replacement therapy (postmenopausal): Secondary | ICD-10-CM | POA: Diagnosis not present

## 2021-08-09 LAB — CBC WITH DIFFERENTIAL/PLATELET
Absolute Monocytes: 292 cells/uL (ref 200–950)
Basophils Absolute: 22 cells/uL (ref 0–200)
Basophils Relative: 0.5 %
Eosinophils Absolute: 52 cells/uL (ref 15–500)
Eosinophils Relative: 1.2 %
HCT: 43.1 % (ref 35.0–45.0)
Hemoglobin: 14.1 g/dL (ref 11.7–15.5)
Lymphs Abs: 1432 cells/uL (ref 850–3900)
MCH: 29.8 pg (ref 27.0–33.0)
MCHC: 32.7 g/dL (ref 32.0–36.0)
MCV: 91.1 fL (ref 80.0–100.0)
MPV: 9.9 fL (ref 7.5–12.5)
Monocytes Relative: 6.8 %
Neutro Abs: 2503 cells/uL (ref 1500–7800)
Neutrophils Relative %: 58.2 %
Platelets: 394 10*3/uL (ref 140–400)
RBC: 4.73 10*6/uL (ref 3.80–5.10)
RDW: 11.5 % (ref 11.0–15.0)
Total Lymphocyte: 33.3 %
WBC: 4.3 10*3/uL (ref 3.8–10.8)

## 2021-08-09 LAB — COMPREHENSIVE METABOLIC PANEL
AG Ratio: 2 (calc) (ref 1.0–2.5)
ALT: 25 U/L (ref 6–29)
AST: 23 U/L (ref 10–35)
Albumin: 4.3 g/dL (ref 3.6–5.1)
Alkaline phosphatase (APISO): 62 U/L (ref 37–153)
BUN: 13 mg/dL (ref 7–25)
CO2: 29 mmol/L (ref 20–32)
Calcium: 9.7 mg/dL (ref 8.6–10.4)
Chloride: 105 mmol/L (ref 98–110)
Creat: 0.72 mg/dL (ref 0.50–1.03)
Globulin: 2.1 g/dL (calc) (ref 1.9–3.7)
Glucose, Bld: 86 mg/dL (ref 65–99)
Potassium: 5.8 mmol/L — ABNORMAL HIGH (ref 3.5–5.3)
Sodium: 140 mmol/L (ref 135–146)
Total Bilirubin: 0.4 mg/dL (ref 0.2–1.2)
Total Protein: 6.4 g/dL (ref 6.1–8.1)

## 2021-08-09 LAB — LIPID PANEL
Cholesterol: 188 mg/dL (ref <200)
HDL: 67 mg/dL (ref >=50)
LDL Cholesterol (Calc): 108 mg/dL (calc) — ABNORMAL HIGH
Non-HDL Cholesterol (Calc): 121 mg/dL (calc) (ref <130)
Total CHOL/HDL Ratio: 2.8 (calc) (ref <5.0)
Triglycerides: 50 mg/dL (ref <150)

## 2021-08-09 LAB — VITAMIN D 25 HYDROXY (VIT D DEFICIENCY, FRACTURES): Vit D, 25-Hydroxy: 33 ng/mL (ref 30–100)

## 2021-08-09 MED ORDER — ESTRADIOL 0.075 MG/24HR TD PTTW: 1.0000 | MEDICATED_PATCH | TRANSDERMAL | 4 refills | Status: DC

## 2021-08-09 MED ORDER — PROGESTERONE 200 MG PO CAPS: 200.0000 mg | ORAL_CAPSULE | Freq: Every day | ORAL | 4 refills | Status: DC

## 2021-08-09 MED ORDER — PROGESTERONE MICRONIZED 100 MG PO CAPS: 100.0000 mg | ORAL_CAPSULE | Freq: Every day | ORAL | 4 refills | Status: DC

## 2021-08-09 NOTE — Progress Notes (Signed)
Susan Hensley 1966/08/23 [id number]   History:  55 y.o. G2P0012 presents for annual exam. Cyclic HRT for hot flashes and mood changes. She does have monthly withdrawal bleeding and would like to take Prometrium daily again.  Normal ultrasound 03/2021 after having some irregular bleeding. 2014 simple endometrial hyperplasia. Normal pap and mammogram history.  Gynecologic History Patient's last menstrual period was 07/26/2021.   Contraception: vasectomy Sexually active: Yes  Health maintenance Last Pap: 07/08/2020. Results were: Normal, 3-year repeat Last mammogram: 04/25/2021. Results were: Normal Last colonoscopy: 07/2018. Results were: Normal, 10-year recall Last Dexa: Not indicated  Past medical history, past surgical history, family history and social history were all reviewed and documented in the EPIC chart. Married. Twins age 66, both at [facility].   ROS:  A ROS was performed and pertinent positives and negatives are included.  Exam:  Vitals:   08/09/21 0904  BP: 120/76  Weight: 110 lb (49.9 kg)  Height: 5' 1.5" (1.562 m)    Body mass index is 20.45 kg/m.  General appearance:  Normal Thyroid:  Symmetrical, normal in size, without palpable masses or nodularity. Respiratory  Auscultation:  Clear without wheezing or rhonchi Cardiovascular  Auscultation:  Regular rate, without rubs, murmurs or gallops  Edema/varicosities:  Not grossly evident Abdominal  Soft,nontender, without masses, guarding or rebound.  Liver/spleen:  No organomegaly noted  Hernia:  None appreciated  Skin  Inspection:  Grossly normal   Breasts: Examined lying and sitting.   Right: Without masses, retractions, discharge or axillary adenopathy.   Left: Without masses, retractions, discharge or axillary adenopathy. Genitourinary   Inguinal/mons:  Normal without inguinal adenopathy  External genitalia:  Normal appearing vulva with no masses, tenderness, or lesions  BUS/Urethra/Skene's  glands:  Normal  Vagina:  Normal appearing with normal color and discharge, no lesions. Atrophic changes. Some vaginal prolapse.   Cervix:  Normal appearing without discharge or lesions  Uterus:  Normal in size, shape and contour.  Midline and mobile, nontender  Adnexa/parametria:     Rt: Normal in size, without masses or tenderness.   Lt: Normal in size, without masses or tenderness.  Anus and perineum: Normal  Digital rectal exam: Normal sphincter tone without palpated masses or tenderness  Patient informed chaperone available to be present for breast and pelvic exam. Patient has requested no chaperone to be present. Patient has been advised what will be completed during breast and pelvic exam.  Assessment/Plan:  55 y.o. [id number] for annual exam.   Well female exam with routine gynecological exam - Plan: CBC with Differential/Platelet, Comprehensive metabolic panel. Education provided on SBEs, importance of preventative screenings, current guidelines, high calcium diet, regular exercise, and multivitamin daily.   Hormone replacement therapy (HRT) - Plan: estradiol (VIVELLE-DOT) 0.075 MG/24HR, progesterone (PROMETRIUM) 100 MG capsule. We will change her Prometrium to nightly. Good management of hot flashes and mood changes.She is aware of the slight risk for blood clots, heart attack, stroke and breast cancer.  She would like to continue.  Refill x1 year provided.  Elevated LDL cholesterol level - Plan: Lipid panel  History of vitamin D deficiency - Plan: VITAMIN D 25 Hydroxy (Vit-D Deficiency, Fractures)  Screening for cervical cancer - Normal Pap history.  Will repeat at 3-year interval per guidelines.   Screening for breast cancer - Normal mammogram history.  Continue annual screenings.  Normal breast exam today.  Screening for colon cancer - Normal colonoscopy in 2019.  She will repeat screening at 10-year interval per GI's recommendation.  Screening for osteoporosis - Average risk.  Will plan for DXA at age 52.   Follow-up in 1 year for annual.       Olivia Mackie [facility], 9:39 AM 08/09/2021

## 2021-08-10 ENCOUNTER — Other Ambulatory Visit: Payer: Self-pay | Admitting: Nurse Practitioner

## 2021-08-10 DIAGNOSIS — E875 Hyperkalemia: Secondary | ICD-10-CM

## 2021-08-17 ENCOUNTER — Telehealth: Payer: Self-pay | Admitting: *Deleted

## 2021-08-17 NOTE — Telephone Encounter (Signed)
Patient called and left message in triage voicemail asking if she should be taking progesterone 200 mg table daily. I called patient and left detailed message on voicemail per patient requesting and DPR access. That on 08/09/21 Tiffany prescribed progesterone 100 mg tablet daily and this is the correct dose she should be taking. I asked her to call if any questions.

## 2021-08-22 ENCOUNTER — Telehealth: Payer: Self-pay

## 2021-08-22 NOTE — Telephone Encounter (Signed)
Pt called reporting the change in progesterone dosage from 200mg  12days a mo to 100mg  daily. Reports that her mail order pharmacy sent her Rx in the mail for 200mg  daily. Pt requesting this being fixed. I called mail order pharmacy and Antoinette from [place] confirmed that they sent an Rx for 100mg  daily and for 200mg  daily to current address on file and both Rxs were delivered on the same day at the same time. Called pt back to inform her of pharmacy reps response. Pt double checked and still had received both Rx's. Pt notified to disregard 200mg  Rx and continue with 100mg  qd. Pt voiced understanding.

## 2021-12-28 DIAGNOSIS — S0501XA Injury of conjunctiva and corneal abrasion without foreign body, right eye, initial encounter: Secondary | ICD-10-CM | POA: Diagnosis not present

## 2021-12-30 DIAGNOSIS — S0501XD Injury of conjunctiva and corneal abrasion without foreign body, right eye, subsequent encounter: Secondary | ICD-10-CM | POA: Diagnosis not present

## 2022-01-12 DIAGNOSIS — S0501XD Injury of conjunctiva and corneal abrasion without foreign body, right eye, subsequent encounter: Secondary | ICD-10-CM | POA: Diagnosis not present

## 2022-01-26 DIAGNOSIS — S0501XA Injury of conjunctiva and corneal abrasion without foreign body, right eye, initial encounter: Secondary | ICD-10-CM | POA: Diagnosis not present

## 2022-03-21 DIAGNOSIS — D2239 Melanocytic nevi of other parts of face: Secondary | ICD-10-CM | POA: Diagnosis not present

## 2022-03-21 DIAGNOSIS — L814 Other melanin hyperpigmentation: Secondary | ICD-10-CM | POA: Diagnosis not present

## 2022-03-21 DIAGNOSIS — L209 Atopic dermatitis, unspecified: Secondary | ICD-10-CM | POA: Diagnosis not present

## 2022-03-21 DIAGNOSIS — D225 Melanocytic nevi of trunk: Secondary | ICD-10-CM | POA: Diagnosis not present

## 2022-05-17 DIAGNOSIS — R059 Cough, unspecified: Secondary | ICD-10-CM | POA: Diagnosis not present

## 2022-05-17 DIAGNOSIS — J029 Acute pharyngitis, unspecified: Secondary | ICD-10-CM | POA: Diagnosis not present

## 2022-06-06 ENCOUNTER — Other Ambulatory Visit: Payer: Self-pay | Admitting: Obstetrics and Gynecology

## 2022-06-06 ENCOUNTER — Other Ambulatory Visit: Payer: Self-pay | Admitting: Nurse Practitioner

## 2022-06-06 DIAGNOSIS — Z1231 Encounter for screening mammogram for malignant neoplasm of breast: Secondary | ICD-10-CM

## 2022-06-29 ENCOUNTER — Ambulatory Visit
Admission: RE | Admit: 2022-06-29 | Discharge: 2022-06-29 | Disposition: A | Discharge status: Home / Self Care | Payer: BC Managed Care – PPO | Source: Ambulatory Visit | Attending: Nurse Practitioner | Admitting: Nurse Practitioner

## 2022-06-29 DIAGNOSIS — Z1231 Encounter for screening mammogram for malignant neoplasm of breast: Secondary | ICD-10-CM

## 2022-08-03 DIAGNOSIS — M65312 Trigger thumb, left thumb: Secondary | ICD-10-CM | POA: Diagnosis not present

## 2022-08-07 ENCOUNTER — Encounter: Payer: Self-pay | Admitting: Nurse Practitioner

## 2022-08-10 ENCOUNTER — Ambulatory Visit: Payer: BC Managed Care – PPO | Admitting: Nurse Practitioner

## 2022-08-23 ENCOUNTER — Ambulatory Visit (INDEPENDENT_AMBULATORY_CARE_PROVIDER_SITE_OTHER): Payer: BC Managed Care – PPO | Admitting: Radiology

## 2022-08-23 ENCOUNTER — Encounter: Payer: Self-pay | Admitting: Radiology

## 2022-08-23 VITALS — BP 118/78 | Ht 61.5 in | Wt 112.0 lb

## 2022-08-23 DIAGNOSIS — Z01419 Encounter for gynecological examination (general) (routine) without abnormal findings: Secondary | ICD-10-CM | POA: Diagnosis not present

## 2022-08-23 DIAGNOSIS — N958 Other specified menopausal and perimenopausal disorders: Secondary | ICD-10-CM | POA: Diagnosis not present

## 2022-08-23 DIAGNOSIS — Z79890 Hormone replacement therapy (postmenopausal): Secondary | ICD-10-CM | POA: Diagnosis not present

## 2022-08-23 MED ORDER — IMVEXXY MAINTENANCE PACK 10 MCG VA INST: 1.0000 | VAGINAL_INSERT | VAGINAL | 11 refills | Status: DC

## 2022-08-23 MED ORDER — ESTRADIOL 0.075 MG/24HR TD PTTW: 1.0000 | MEDICATED_PATCH | TRANSDERMAL | 4 refills | Status: DC

## 2022-08-23 MED ORDER — PROGESTERONE MICRONIZED 100 MG PO CAPS: 100.0000 mg | ORAL_CAPSULE | Freq: Every day | ORAL | 4 refills | Status: DC

## 2022-08-23 NOTE — Progress Notes (Signed)
Susan Hensley 12-21-1965 [id number]   History: Postmenopausal 56 y.o. presents for annual exam. Doing well on HRT, struggling with vaginal dryness and dyspareunia. Using coconut oil as a lubricant which helps some. No other gyn concerns. Up to date on screenings.    Gynecologic History Postmenopausal Last Pap: 2021. Results were: normal Last mammogram: 2023. Results were: normal HRT use: current  Obstetric History OB History  Gravida Para Term Preterm AB Living  2       1 2   SAB IAB Ectopic Multiple Live Births  1            # Outcome Date GA Lbr Len/2nd Weight Sex Delivery Anes PTL Lv  2 Gravida           1 SAB              The following portions of the patient's history were reviewed and updated as appropriate: allergies, current medications, past family history, past medical history, past social history, past surgical history, and problem list.  Review of Systems Pertinent items noted in HPI and remainder of comprehensive ROS otherwise negative.  Past medical history, past surgical history, family history and social history were all reviewed and documented in the EPIC chart.  Exam:  Vitals:   08/23/22 1327  BP: 118/78  Weight: 112 lb (50.8 kg)  Height: 5' 1.5" (1.562 m)   Body mass index is 20.82 kg/m.  General appearance:  Normal Thyroid:  Symmetrical, normal in size, without palpable masses or nodularity. Respiratory  Auscultation:  Clear without wheezing or rhonchi Cardiovascular  Auscultation:  Regular rate, without rubs, murmurs or gallops  Edema/varicosities:  Not grossly evident Abdominal  Soft,nontender, without masses, guarding or rebound.  Liver/spleen:  No organomegaly noted  Hernia:  None appreciated  Skin  Inspection:  Grossly normal Breasts: Examined lying and sitting.   Right: Without masses, retractions, nipple discharge or axillary adenopathy.   Left: Without masses, retractions, nipple discharge or axillary adenopathy. Genitourinary    Inguinal/mons:  Normal without inguinal adenopathy  External genitalia:  Normal appearing vulva with no masses, tenderness, or lesions  BUS/Urethra/Skene's glands:  Normal  Vagina:  Normal appearing with normal color and discharge, no lesions. Atrophy: moderate. Cystocele stage 1    Cervix:  Normal appearing without discharge or lesions  Uterus:  Normal in size, shape and contour.  Midline and mobile, nontender  Adnexa/parametria:     Rt: Normal in size, without masses or tenderness.   Lt: Normal in size, without masses or tenderness.  Anus and perineum: Normal    Patient informed chaperone available to be present for breast and pelvic exam. Patient has requested no chaperone to be present. Patient has been advised what will be completed during breast and pelvic exam.   Assessment/Plan:   1. Well woman exam with routine gynecological exam Pap due 2024  2. Hormone replacement therapy (HRT) Continue current doses of HRT, doing well - estradiol (VIVELLE-DOT) 0.075 MG/24HR; Place 1 patch onto the skin 2 (two) times a week.  Dispense: 24 patch; Refill: 4 - progesterone (PROMETRIUM) 100 MG capsule; Take 1 capsule (100 mg total) by mouth daily.  Dispense: 90 capsule; Refill: 4  3. Genitourinary syndrome of menopause Begin Imvexxy to help with dryness and dyspareunia. - Estradiol (IMVEXXY MAINTENANCE PACK) 10 MCG INST; Place 1 tablet vaginally 2 (two) times a week.  Dispense: 8 each; Refill: 11    Discussed SBE, colonoscopy and DEXA screening as directed. Recommend 2025 of exercise  weekly, including weight bearing exercise. Encouraged the use of seatbelts and sunscreen.  Return in 1 year for annual or sooner prn.  Adisa Vigeant B WHNP-BC, 1:50 PM 08/23/2022

## 2022-09-26 DIAGNOSIS — H524 Presbyopia: Secondary | ICD-10-CM | POA: Diagnosis not present

## 2022-10-04 ENCOUNTER — Telehealth: Payer: Self-pay

## 2022-10-04 ENCOUNTER — Other Ambulatory Visit: Payer: Self-pay

## 2022-10-04 DIAGNOSIS — Z79890 Hormone replacement therapy (postmenopausal): Secondary | ICD-10-CM

## 2022-10-04 MED ORDER — ESTRADIOL 0.075 MG/24HR TD PTTW: 1.0000 | MEDICATED_PATCH | TRANSDERMAL | 4 refills | Status: DC

## 2022-10-04 NOTE — Telephone Encounter (Signed)
Patient called because the mail order pharmacy does not have her Rx for Estradiol Patches.  I called the pharmacy attached to Rx and received voice mail that this number no longer valid and we need to update Rx in EMR to [facility] in [place], [place]. When I went to update pharmacy it appears since it was prescribed in December to [facility] in [place] that it was updated in EMR.  I went ahead and resent Rx to the correct pharmacy.

## 2022-10-04 NOTE — Telephone Encounter (Signed)
Spoke with patient and informed her. °

## 2022-10-26 DIAGNOSIS — M7711 Lateral epicondylitis, right elbow: Secondary | ICD-10-CM | POA: Diagnosis not present

## 2022-10-26 DIAGNOSIS — M7712 Lateral epicondylitis, left elbow: Secondary | ICD-10-CM | POA: Diagnosis not present

## 2022-10-26 DIAGNOSIS — M65312 Trigger thumb, left thumb: Secondary | ICD-10-CM | POA: Diagnosis not present

## 2022-11-01 DIAGNOSIS — J069 Acute upper respiratory infection, unspecified: Secondary | ICD-10-CM | POA: Diagnosis not present

## 2022-11-24 ENCOUNTER — Ambulatory Visit: Payer: BC Managed Care – PPO | Admitting: Obstetrics & Gynecology

## 2022-11-24 ENCOUNTER — Encounter: Payer: Self-pay | Admitting: Obstetrics & Gynecology

## 2022-11-24 ENCOUNTER — Other Ambulatory Visit (HOSPITAL_COMMUNITY)
Admission: RE | Admit: 2022-11-24 | Discharge: 2022-11-24 | Disposition: A | Discharge status: Home / Self Care | Payer: BC Managed Care – PPO | Source: Ambulatory Visit | Attending: Obstetrics & Gynecology | Admitting: Obstetrics & Gynecology

## 2022-11-24 VITALS — BP 98/60 | HR 68 | Wt 109.0 lb

## 2022-11-24 DIAGNOSIS — N951 Menopausal and female climacteric states: Secondary | ICD-10-CM

## 2022-11-24 DIAGNOSIS — N841 Polyp of cervix uteri: Secondary | ICD-10-CM | POA: Insufficient documentation

## 2022-11-24 DIAGNOSIS — Z79890 Hormone replacement therapy (postmenopausal): Secondary | ICD-10-CM

## 2022-11-24 DIAGNOSIS — N950 Postmenopausal bleeding: Secondary | ICD-10-CM | POA: Insufficient documentation

## 2022-11-24 MED ORDER — PROGESTERONE MICRONIZED 100 MG PO CAPS: 100.0000 mg | ORAL_CAPSULE | Freq: Every day | ORAL | 4 refills | Status: DC

## 2022-11-24 NOTE — Progress Notes (Signed)
    Susan Hensley [id number] [id number]        57 y.o.  G2P1A12   RP: PMB x 5 days  HPI: Postmenopause on HRT with Estradiol patch 0.075 twice a week and Prometrium 100 mg HS. Occasionally misses a Prometrium pill. PMB started 6 days ago on Sunday 11/19/22 and lasted 5 days.  No bleeding today.  No pelvic pain. Last Annual Gyn exam with Jami C. on 08/23/22.  OB History  Gravida Para Term Preterm AB Living  2 1 1   1 2   SAB IAB Ectopic Multiple Live Births  1     1      # Outcome Date GA Lbr Len/2nd Weight Sex Delivery Anes PTL Lv  2 SAB           1A Term           1B Term             Past medical history,surgical history, problem list, medications, allergies, family history and social history were all reviewed and documented in the EPIC chart.   Directed ROS with pertinent positives and negatives documented in the history of present illness/assessment and plan.  Exam:  Vitals:   11/24/22 1342  BP: 98/60  Pulse: 68  SpO2: 99%  Weight: 109 lb (49.4 kg)   General appearance:  Normal  Abdomen: Normal  Informed written consent for EBx obtained.  Gynecologic exam: Vulva normal.  Speculum:  Hurricane spray.  Betadine prep.  Cervix normal with a small endocervical polyp.  Tenaculum on anterior lip of cervix.  Os finder.  Endometrial Bx with pipette on all endometrial surfaces with negative pressure.  Pipette removed.  Specimen moderate.  Sent to patho.  Tenaculum removed.  Endocervical polyp removed with a small clamp with rotations.  Specimen sent separately to patho.  Hemostasis with Silver Nitrate.  Speculum removed.  Procedure well tolerated with no Cx.   Assessment/Plan:  57 y.o. G2P1012   1. Postmenopausal bleeding Postmenopause on HRT with Estradiol patch 0.075 twice a week and Prometrium 100 mg HS. Occasionally misses a Prometrium pill. PMB started 6 days ago on Sunday 11/19/22 and lasted 5 days.  No bleeding today.  No pelvic pain.  EBx done.  Will also further  investigated with a Pelvic US at f/u.  Post procedure precautions. - Surgical pathology( [facility]/ [facility]) - US Transvaginal Non-OB; Future  2. Endocervical polyp Easy removal of small Endocervical Polyp.  Patho pending. - Surgical pathology( [facility]/ [facility]) - US Transvaginal Non-OB; Future  3. Menopausal syndrome on hormone replacement therapy  4. Hormone replacement therapy (HRT) Recommended to stop HRT until the Pelvic US, but patient very worried about her moods off HRT.  Given that the EBx was performed, agreed for patient to continue HRT unless the EBx patho is concerning for EIN/Endometrial Ca.  Pending EBx. - progesterone (PROMETRIUM) 100 MG capsule; Take 1 capsule (100 mg total) by mouth at bedtime.  Other orders - Azelastine HCl 137 MCG/SPRAY SOLN; Place into both nostrils. (Patient not taking: Reported on 11/24/2022) - meloxicam (MOBIC) 15 MG tablet; Take 15 mg by mouth daily. (Patient not taking: Reported on 11/24/2022)   [name], 1:52 PM 11/24/2022

## 2022-11-27 ENCOUNTER — Telehealth: Payer: Self-pay

## 2022-11-27 NOTE — Telephone Encounter (Signed)
Pt calling to report not being able to get [place] appt until 01/04/2023 and inquiring about EMB results.   Pt notified that it could take ~3-5 business days for [place] to receive results then provider needs to review and provide recommendations if any changes need to be made to treatment plan. And pt also notified that if [place] is recommended sooner, we can send her to an external location with more/sooner availability. Pt voiced understanding.  Pt also is inquiring if she has any restrictions post EMB as far as bathing and/or intercourse? Please advise.

## 2022-11-28 LAB — SURGICAL PATHOLOGY

## 2022-11-28 NOTE — Telephone Encounter (Signed)
Per ML: "No restriction post EBx at this point."   Pt notified and voiced understanding.

## 2023-01-02 ENCOUNTER — Other Ambulatory Visit: Payer: BC Managed Care – PPO

## 2023-01-04 ENCOUNTER — Other Ambulatory Visit: Payer: BC Managed Care – PPO

## 2023-01-04 ENCOUNTER — Other Ambulatory Visit: Payer: BC Managed Care – PPO | Admitting: Obstetrics & Gynecology

## 2023-02-22 ENCOUNTER — Other Ambulatory Visit: Payer: BC Managed Care – PPO

## 2023-02-22 ENCOUNTER — Other Ambulatory Visit: Payer: BC Managed Care – PPO | Admitting: Obstetrics & Gynecology

## 2023-03-03 DIAGNOSIS — L237 Allergic contact dermatitis due to plants, except food: Secondary | ICD-10-CM | POA: Diagnosis not present

## 2023-03-27 ENCOUNTER — Ambulatory Visit (INDEPENDENT_AMBULATORY_CARE_PROVIDER_SITE_OTHER): Payer: BC Managed Care – PPO

## 2023-03-27 DIAGNOSIS — N950 Postmenopausal bleeding: Secondary | ICD-10-CM

## 2023-03-27 DIAGNOSIS — N841 Polyp of cervix uteri: Secondary | ICD-10-CM

## 2023-03-29 ENCOUNTER — Telehealth: Payer: Self-pay | Admitting: Obstetrics and Gynecology

## 2023-03-29 NOTE — Telephone Encounter (Signed)
Pt notified and voiced understanding.   Pt wanted me to let you know that she wasn't sure if the change in her HRT from cyclic to daily in 08/2021 was the reason for her bleeding but states that she reviewed her log for the last two months and states that she actually isn't bleeding monthly but if does it is only for a couple days.  States she desired to go daily HRT from cyclic due to having withdrawal bleeding while on the cyclic.  Pt reports just wanted to let you know this info and will keep appt w/ you for August. Will route to provider for final review and close.

## 2023-03-29 NOTE — Telephone Encounter (Signed)
Please contact patient in follow up to her pelvic ultrasound done for postmenopausal bleeding on HRT.   Her ultrasound shows slight thickening of the endometrium, although no specific mass was seen in this area.  She has 2 small fibroids, each less than 1 cm.  These are considered benign.  Her ovaries are normal.  Please have her keep her appointment with me in August to review this further.  She will need a pap at that time as part of her evaluation for the bleeding.

## 2023-04-03 NOTE — Progress Notes (Unsigned)
GYNECOLOGY  VISIT   HPI: 57 y.o.   Married  Caucasian  female   G2P1012 with Patient's last menstrual period was 07/26/2021.   here for  pelvic U/S consult and pap.  Had postmenopausal bleeding on HRT.  Occasional missed Prometrium.  She has sporadic bleeding, the last time was prior to her endometrial biopsy appointment.  She has been taking daily Prometrium for 2.5 - 3 years.  Took cyclically prior to this.   EMB done 11/24/22:  benign weakly proliferative endometrium.   She stopped black cohosh and yams.   Pelvic US 03/27/23 Findings: Pelvic ultrasound: Uterus 7.76 x 4.46 x 5.29 cm.  Fibroids:  0.72 cm and 0.58 cm.  Intramural. EMS 5.29 mm. No masses.  Avascular.  Left ovary 1.86 x 1.27 x 0.99 cm.  Right ovary 1.98 x 0.91 x 1.04 cm.  No adnexal masses.  No free fluid.   Hx simple endometrial hyperplasia.   GYNECOLOGIC HISTORY: Patient's last menstrual period was 07/26/2021. Contraception:  PMP Menopausal hormone therapy:  progesterone Last mammogram:  06/29/22 Breast density Cat C, BI-RADS CAT 1 neg Last pap smear:   07/08/20 neg, 02/11/15 neg        OB History     Gravida  2   Para  1   Term  1   Preterm      AB  1   Living  2      SAB  1   IAB      Ectopic      Multiple  1   Live Births                 Patient Active Problem List   Diagnosis Date Noted   Simple endometrial hyperplasia 05/29/2017    History reviewed. No pertinent past medical history.  Past Surgical History:  Procedure Laterality Date   DILATION AND CURETTAGE OF UTERUS     FOOT SURGERY Bilateral 1986    Current Outpatient Medications  Medication Sig Dispense Refill   estradiol (VIVELLE-DOT) 0.075 MG/24HR Place 1 patch onto the skin 2 (two) times a week. 24 patch 4   IMVEXXY MAINTENANCE PACK 10 MCG INST Place 1 capsule vaginally 2 (two) times a week.     progesterone (PROMETRIUM) 100 MG capsule Take 1 capsule (100 mg total) by mouth at bedtime. 90 capsule 4   No  current facility-administered medications for this visit.     ALLERGIES: Patient has no known allergies.  Family History  Problem Relation Age of Onset   Diabetes Maternal Grandmother    Heart failure Maternal Grandfather    Breast cancer Neg Hx     Social History   Socioeconomic History   Marital status: Married    Spouse name: Not on file   Number of children: Not on file   Years of education: Not on file   Highest education level: Not on file  Occupational History   Not on file  Tobacco Use   Smoking status: Never   Smokeless tobacco: Never  Vaping Use   Vaping status: Never Used  Substance and Sexual Activity   Alcohol use: Not Currently   Drug use: No   Sexual activity: Yes    Comment: Vasectomy-First IC >16y/o, <5 Partners  Other Topics Concern   Not on file  Social History Narrative   Not on file   Social Determinants of Health   Financial Resource Strain: Not on file  Food Insecurity: Not on file  Transportation Needs:  Not on file  Physical Activity: Not on file  Stress: Not on file  Social Connections: Not on file  Intimate Partner Violence: Not on file    Review of Systems  All other systems reviewed and are negative.   PHYSICAL EXAMINATION:    BP 118/72 (BP Location: Right Arm, Patient Position: Sitting, Cuff Size: Normal)   Pulse 67   Ht 5\' 1"  (1.549 m)   Wt 111 lb (50.3 kg)   LMP 07/26/2021 Comment: sexually active  SpO2 99%   BMI 20.97 kg/m     General appearance: alert, cooperative and appears stated age  Pelvic: External genitalia:  no lesions              Urethra:  normal appearing urethra with no masses, tenderness or lesions              Bartholins and Skenes: normal                 Vagina: normal appearing vagina with normal color and discharge, no lesions              Cervix: no lesions              Pap collected.  Bimanual Exam:  Uterus:  normal size, contour, position, consistency, mobility, non-tender              Adnexa:  no mass, fullness, tenderness         Chaperone was present for exam:  Warren Lacy, CMA  ASSESSMENT  Hx postmenopausal bleeding.  On HRT, continuous.  Hx prior simple endometrial hyperplasia.  Recent EMB showed weakly proliferative endometrium.  Small fibroids on pelvic ultrasound.  PLAN  Pap and HR HPV collected.  We discussed her postmenopausal bleeding, HRT regimens.  She will continue her current HRT regimen.  Follow up for annual exam and prn.     20 min  total time was spent for this patient encounter, including preparation, face-to-face counseling with the patient, coordination of care, and documentation of the encounter.

## 2023-04-17 ENCOUNTER — Ambulatory Visit: Payer: BC Managed Care – PPO | Admitting: Obstetrics and Gynecology

## 2023-04-17 ENCOUNTER — Other Ambulatory Visit: Payer: BC Managed Care – PPO

## 2023-04-17 ENCOUNTER — Encounter: Payer: Self-pay | Admitting: Obstetrics and Gynecology

## 2023-04-17 ENCOUNTER — Other Ambulatory Visit: Payer: BC Managed Care – PPO | Admitting: Obstetrics and Gynecology

## 2023-04-17 ENCOUNTER — Other Ambulatory Visit (HOSPITAL_COMMUNITY)
Admission: RE | Admit: 2023-04-17 | Discharge: 2023-04-17 | Disposition: A | Discharge status: Home / Self Care | Payer: BC Managed Care – PPO | Source: Ambulatory Visit | Attending: Obstetrics and Gynecology | Admitting: Obstetrics and Gynecology

## 2023-04-17 VITALS — BP 118/72 | HR 67 | Ht 61.0 in | Wt 111.0 lb

## 2023-04-17 DIAGNOSIS — Z124 Encounter for screening for malignant neoplasm of cervix: Secondary | ICD-10-CM | POA: Diagnosis not present

## 2023-04-17 DIAGNOSIS — Z79890 Hormone replacement therapy (postmenopausal): Secondary | ICD-10-CM | POA: Diagnosis not present

## 2023-04-17 DIAGNOSIS — Z8742 Personal history of other diseases of the female genital tract: Secondary | ICD-10-CM

## 2023-04-23 LAB — CYTOLOGY - PAP: Adequacy: ABSENT

## 2023-05-25 ENCOUNTER — Other Ambulatory Visit: Payer: Self-pay | Admitting: Radiology

## 2023-05-25 DIAGNOSIS — Z79890 Hormone replacement therapy (postmenopausal): Secondary | ICD-10-CM

## 2023-06-13 DIAGNOSIS — L603 Nail dystrophy: Secondary | ICD-10-CM | POA: Diagnosis not present

## 2023-06-13 DIAGNOSIS — M19071 Primary osteoarthritis, right ankle and foot: Secondary | ICD-10-CM | POA: Diagnosis not present

## 2023-06-13 DIAGNOSIS — M19072 Primary osteoarthritis, left ankle and foot: Secondary | ICD-10-CM | POA: Diagnosis not present

## 2023-06-14 ENCOUNTER — Other Ambulatory Visit: Payer: Self-pay | Admitting: Radiology

## 2023-06-14 DIAGNOSIS — Z1231 Encounter for screening mammogram for malignant neoplasm of breast: Secondary | ICD-10-CM

## 2023-06-18 DIAGNOSIS — F901 Attention-deficit hyperactivity disorder, predominantly hyperactive type: Secondary | ICD-10-CM | POA: Diagnosis not present

## 2023-06-18 DIAGNOSIS — Z1231 Encounter for screening mammogram for malignant neoplasm of breast: Secondary | ICD-10-CM

## 2023-06-20 ENCOUNTER — Other Ambulatory Visit: Payer: Self-pay | Admitting: Radiology

## 2023-06-20 DIAGNOSIS — Z1231 Encounter for screening mammogram for malignant neoplasm of breast: Secondary | ICD-10-CM

## 2023-07-13 ENCOUNTER — Ambulatory Visit
Admission: RE | Admit: 2023-07-13 | Discharge: 2023-07-13 | Disposition: A | Discharge status: Home / Self Care | Payer: BC Managed Care – PPO | Source: Ambulatory Visit

## 2023-07-13 DIAGNOSIS — Z1231 Encounter for screening mammogram for malignant neoplasm of breast: Secondary | ICD-10-CM | POA: Diagnosis not present

## 2023-07-18 DIAGNOSIS — M2031 Hallux varus (acquired), right foot: Secondary | ICD-10-CM | POA: Diagnosis not present

## 2023-08-30 ENCOUNTER — Encounter: Payer: Self-pay | Admitting: Radiology

## 2023-08-30 ENCOUNTER — Ambulatory Visit: Payer: BC Managed Care – PPO | Admitting: Radiology

## 2023-08-30 VITALS — BP 112/70 | HR 71 | Ht 61.0 in | Wt 107.0 lb

## 2023-08-30 DIAGNOSIS — Z79890 Hormone replacement therapy (postmenopausal): Secondary | ICD-10-CM | POA: Diagnosis not present

## 2023-08-30 DIAGNOSIS — R29890 Loss of height: Secondary | ICD-10-CM

## 2023-08-30 DIAGNOSIS — N958 Other specified menopausal and perimenopausal disorders: Secondary | ICD-10-CM

## 2023-08-30 DIAGNOSIS — N950 Postmenopausal bleeding: Secondary | ICD-10-CM

## 2023-08-30 DIAGNOSIS — Z01419 Encounter for gynecological examination (general) (routine) without abnormal findings: Secondary | ICD-10-CM | POA: Diagnosis not present

## 2023-08-30 DIAGNOSIS — E2839 Other primary ovarian failure: Secondary | ICD-10-CM

## 2023-08-30 MED ORDER — ESTRADIOL 0.075 MG/24HR TD PTTW: 1.0000 | MEDICATED_PATCH | TRANSDERMAL | 4 refills | Status: DC

## 2023-08-30 MED ORDER — IMVEXXY MAINTENANCE PACK 10 MCG VA INST: 1.0000 | VAGINAL_INSERT | VAGINAL | 11 refills | Status: DC

## 2023-08-30 MED ORDER — PROGESTERONE MICRONIZED 100 MG PO CAPS: 200.0000 mg | ORAL_CAPSULE | Freq: Every day | ORAL | 4 refills | Status: DC

## 2023-08-30 NOTE — Progress Notes (Signed)
Susan Hensley 1966-08-29 [id number]   History: Postmenopausal 57 y.o. presents for annual exam. Concerned about further height loss the past few years. Continues to have spotting monthly with HRT.   Pelvic US 03/27/23 Findings: Pelvic ultrasound: Uterus 7.76 x 4.46 x 5.29 cm.  Fibroids:  0.72 cm and 0.58 cm.  Intramural. EMS 5.29 mm. No masses.  Avascular.  Left ovary 1.86 x 1.27 x 0.99 cm.  Right ovary 1.98 x 0.91 x 1.04 cm.  No adnexal masses.  No free fluid.    Hx simple endometrial hyperplasia.  Gynecologic History Postmenopausal Last Pap: 04/2023. Results were: normal Last mammogram: 11/24. Results were: normal Last colonoscopy: 2021 DEXA:never HRT use: current  Obstetric History OB History  Gravida Para Term Preterm AB Living  2 1 1  1 2   SAB IAB Ectopic Multiple Live Births  1   1     # Outcome Date GA Lbr Len/2nd Weight Sex Type Anes PTL Lv  2 SAB           1A Term           1B Term              The following portions of the patient's history were reviewed and updated as appropriate: allergies, current medications, past family history, past medical history, past social history, past surgical history, and problem list.  Review of Systems Pertinent items noted in HPI and remainder of comprehensive ROS otherwise negative.  Past medical history, past surgical history, family history and social history were all reviewed and documented in the EPIC chart.  Exam:  Vitals:   08/30/23 1445  BP: 112/70  Pulse: 71  SpO2: 95%  Weight: 107 lb (48.5 kg)  Height: 5\' 1"  (1.549 m)   Body mass index is 20.22 kg/m.  General appearance:  Normal Thyroid:  Symmetrical, normal in size, without palpable masses or nodularity. Respiratory  Auscultation:  Clear without wheezing or rhonchi Cardiovascular  Auscultation:  Regular rate, without rubs, murmurs or gallops  Edema/varicosities:  Not grossly evident Abdominal  Soft,nontender, without masses, guarding or  rebound.  Liver/spleen:  No organomegaly noted  Hernia:  None appreciated  Skin  Inspection:  Grossly normal Breasts: Examined lying and sitting.   Right: Without masses, retractions, nipple discharge or axillary adenopathy.   Left: Without masses, retractions, nipple discharge or axillary adenopathy. Genitourinary   Inguinal/mons:  Normal without inguinal adenopathy  External genitalia:  Normal appearing vulva with no masses, tenderness, or lesions  BUS/Urethra/Skene's glands:  Normal  Vagina:  Normal appearing with normal color and discharge, no lesions. Atrophy: improved   Cervix:  Normal appearing without discharge or lesions  Uterus:  Normal in size, shape and contour.  Midline and mobile, nontender  Adnexa/parametria:     Rt: Normal in size, without masses or tenderness.   Lt: Normal in size, without masses or tenderness.  Anus and perineum: Normal    Raynelle Fanning, CMA present for exam  Assessment/Plan:   1. Well woman exam with routine gynecological exam (Primary) Pap 2027  2. Genitourinary syndrome of menopause - Estradiol (IMVEXXY MAINTENANCE PACK) 10 MCG INST; Place 1 tablet vaginally 2 (two) times a week.  Dispense: 8 each; Refill: 11  3. Estrogen deficiency - DG Bone Density; Future  4. Height loss - DG Bone Density; Future  5. Postmenopausal bleeding Will increase progesterone to minimize bleeding, if no improvement after 2 months will repeat EMB  6. Hormone replacement therapy (HRT) - progesterone (  PROMETRIUM) 100 MG capsule; Take 2 capsules (200 mg total) by mouth at bedtime.  Dispense: 180 capsule; Refill: 4 - estradiol (VIVELLE-DOT) 0.075 MG/24HR; Place 1 patch onto the skin 2 (two) times a week.  Dispense: 24 patch; Refill: 4    Discussed SBE, colonoscopy and DEXA screening as directed. Recommend of exercise weekly, including weight bearing exercise. Encouraged the use of seatbelts and sunscreen.  Return in 1 year for annual or sooner  prn.  Arlie Solomons B WHNP-BC, 3:22 PM 08/30/2023

## 2023-09-28 ENCOUNTER — Other Ambulatory Visit (HOSPITAL_BASED_OUTPATIENT_CLINIC_OR_DEPARTMENT_OTHER): Payer: BC Managed Care – PPO | Admitting: Radiology

## 2023-10-05 ENCOUNTER — Ambulatory Visit (INDEPENDENT_AMBULATORY_CARE_PROVIDER_SITE_OTHER)
Admission: RE | Admit: 2023-10-05 | Discharge: 2023-10-05 | Disposition: A | Discharge status: Home / Self Care | Payer: BC Managed Care – PPO | Source: Ambulatory Visit | Attending: Radiology | Admitting: Radiology

## 2023-10-05 DIAGNOSIS — Z780 Asymptomatic menopausal state: Secondary | ICD-10-CM | POA: Diagnosis not present

## 2023-10-05 DIAGNOSIS — E2839 Other primary ovarian failure: Secondary | ICD-10-CM | POA: Diagnosis not present

## 2023-10-05 DIAGNOSIS — R29890 Loss of height: Secondary | ICD-10-CM

## 2023-10-17 ENCOUNTER — Encounter: Payer: Self-pay | Admitting: Obstetrics and Gynecology

## 2023-10-25 DIAGNOSIS — M1812 Unilateral primary osteoarthritis of first carpometacarpal joint, left hand: Secondary | ICD-10-CM | POA: Diagnosis not present

## 2023-10-25 DIAGNOSIS — M7711 Lateral epicondylitis, right elbow: Secondary | ICD-10-CM | POA: Diagnosis not present

## 2024-01-03 ENCOUNTER — Other Ambulatory Visit: Payer: Self-pay

## 2024-01-03 DIAGNOSIS — Z79890 Hormone replacement therapy (postmenopausal): Secondary | ICD-10-CM

## 2024-01-03 NOTE — Telephone Encounter (Signed)
 Pt LVM in med refill line stating that she received an email from her pharmacy stating that she was out of refills on her progesterone .

## 2024-01-03 NOTE — Telephone Encounter (Signed)
 Patient LM on RF line, regarding progesterone  100mg  did not have refills. Last AEX: 08/30/23 Next AEX:  none scheduled Last MMG (if hormonal med) 07/13/23 BI-RADS 1 negative RX written 08/30/23 progesterone  100mg  #2 capsules at bedtime, #180 with 4 refills. Contacted patient, LVMOM to have her reach out to her pharmacy as prescription had refills.

## 2024-03-25 DIAGNOSIS — H01014 Ulcerative blepharitis left upper eyelid: Secondary | ICD-10-CM | POA: Diagnosis not present

## 2024-03-25 DIAGNOSIS — H01012 Ulcerative blepharitis right lower eyelid: Secondary | ICD-10-CM | POA: Diagnosis not present

## 2024-06-30 ENCOUNTER — Other Ambulatory Visit: Payer: Self-pay | Admitting: Radiology

## 2024-06-30 DIAGNOSIS — Z1231 Encounter for screening mammogram for malignant neoplasm of breast: Secondary | ICD-10-CM

## 2024-07-30 ENCOUNTER — Ambulatory Visit
Admission: RE | Admit: 2024-07-30 | Discharge: 2024-07-30 | Disposition: A | Discharge status: Home / Self Care | Source: Ambulatory Visit

## 2024-07-30 DIAGNOSIS — Z1231 Encounter for screening mammogram for malignant neoplasm of breast: Secondary | ICD-10-CM | POA: Diagnosis not present

## 2024-08-15 DIAGNOSIS — J40 Bronchitis, not specified as acute or chronic: Secondary | ICD-10-CM | POA: Diagnosis not present

## 2024-08-15 DIAGNOSIS — J329 Chronic sinusitis, unspecified: Secondary | ICD-10-CM | POA: Diagnosis not present

## 2024-08-16 ENCOUNTER — Ambulatory Visit (HOSPITAL_BASED_OUTPATIENT_CLINIC_OR_DEPARTMENT_OTHER)

## 2024-08-26 DIAGNOSIS — H2513 Age-related nuclear cataract, bilateral: Secondary | ICD-10-CM | POA: Diagnosis not present

## 2024-09-02 ENCOUNTER — Ambulatory Visit: Admitting: Radiology

## 2024-09-02 ENCOUNTER — Ambulatory Visit (INDEPENDENT_AMBULATORY_CARE_PROVIDER_SITE_OTHER): Admitting: Radiology

## 2024-09-02 ENCOUNTER — Encounter: Payer: Self-pay | Admitting: Radiology

## 2024-09-02 VITALS — BP 116/58 | HR 89 | Ht 61.0 in | Wt 113.0 lb

## 2024-09-02 DIAGNOSIS — Z1331 Encounter for screening for depression: Secondary | ICD-10-CM

## 2024-09-02 DIAGNOSIS — R6882 Decreased libido: Secondary | ICD-10-CM | POA: Diagnosis not present

## 2024-09-02 DIAGNOSIS — N958 Other specified menopausal and perimenopausal disorders: Secondary | ICD-10-CM | POA: Diagnosis not present

## 2024-09-02 DIAGNOSIS — Z01419 Encounter for gynecological examination (general) (routine) without abnormal findings: Secondary | ICD-10-CM

## 2024-09-02 DIAGNOSIS — Z79890 Hormone replacement therapy (postmenopausal): Secondary | ICD-10-CM

## 2024-09-02 MED ORDER — PROGESTERONE MICRONIZED 100 MG PO CAPS: 200.0000 mg | ORAL_CAPSULE | Freq: Every day | ORAL | 4 refills | Status: AC

## 2024-09-02 MED ORDER — IMVEXXY MAINTENANCE PACK 10 MCG VA INST: 1.0000 | VAGINAL_INSERT | VAGINAL | 11 refills | Status: AC

## 2024-09-02 MED ORDER — ESTRADIOL 0.075 MG/24HR TD PTTW: 1.0000 | MEDICATED_PATCH | TRANSDERMAL | 4 refills | Status: DC

## 2024-09-02 NOTE — Patient Instructions (Signed)
 Preventive Care 58-58 Years Old, Female  Preventive care refers to lifestyle choices and visits with your health care provider that can promote health and wellness. Preventive care visits are also called wellness exams.  What can I expect for my preventive care visit?  Counseling  Your health care provider may ask you questions about your:  Medical history, including:  Past medical problems.  Family medical history.  Pregnancy history.  Current health, including:  Menstrual cycle.  Method of birth control.  Emotional well-being.  Home life and relationship well-being.  Sexual activity and sexual health.  Lifestyle, including:  Alcohol, nicotine or tobacco, and drug use.  Access to firearms.  Diet, exercise, and sleep habits.  Work and work Astronomer.  Sunscreen use.  Safety issues such as seatbelt and bike helmet use.  Physical exam  Your health care provider will check your:  Height and weight. These may be used to calculate your BMI (body mass index). BMI is a measurement that tells if you are at a healthy weight.  Waist circumference. This measures the distance around your waistline. This measurement also tells if you are at a healthy weight and may help predict your risk of certain diseases, such as type 2 diabetes and high blood pressure.  Heart rate and blood pressure.  Body temperature.  Skin for abnormal spots.  What immunizations do I need?    Vaccines are usually given at various ages, according to a schedule. Your health care provider will recommend vaccines for you based on your age, medical history, and lifestyle or other factors, such as travel or where you work.  What tests do I need?  Screening  Your health care provider may recommend screening tests for certain conditions. This may include:  Lipid and cholesterol levels.  Diabetes screening. This is done by checking your blood sugar (glucose) after you have not eaten for a while (fasting).  Pelvic exam and Pap test.  Hepatitis B test.  Hepatitis C  test.  HIV (human immunodeficiency virus) test.  STI (sexually transmitted infection) testing, if you are at risk.  Lung cancer screening.  Colorectal cancer screening.  Mammogram. Talk with your health care provider about when you should start having regular mammograms. This may depend on whether you have a family history of breast cancer.  BRCA-related cancer screening. This may be done if you have a family history of breast, ovarian, tubal, or peritoneal cancers.  Bone density scan. This is done to screen for osteoporosis.  Talk with your health care provider about your test results, treatment options, and if necessary, the need for more tests.  Follow these instructions at home:  Eating and drinking    Eat a diet that includes fresh fruits and vegetables, whole grains, lean protein, and low-fat dairy products.  Take vitamin and mineral supplements as recommended by your health care provider.  Do not drink alcohol if:  Your health care provider tells you not to drink.  You are pregnant, may be pregnant, or are planning to become pregnant.  If you drink alcohol:  Limit how much you have to 0-1 drink a day.  Know how much alcohol is in your drink. In the [place] one drink equals one 12 oz bottle of beer (355 mL), one 5 oz glass of wine (148 mL), or one 1 oz glass of hard liquor (44 mL).  Lifestyle  Brush your teeth every morning and night with fluoride toothpaste. Floss one time each day.  Exercise for at least  30 minutes 5 or more days each week.  Do not use any products that contain nicotine or tobacco. These products include cigarettes, chewing tobacco, and vaping devices, such as e-cigarettes. If you need help quitting, ask your health care provider.  Do not use drugs.  If you are sexually active, practice safe sex. Use a condom or other form of protection to prevent STIs.  If you do not wish to become pregnant, use a form of birth control. If you plan to become pregnant, see your health care provider for a  prepregnancy visit.  Take aspirin only as told by your health care provider. Make sure that you understand how much to take and what form to take. Work with your health care provider to find out whether it is safe and beneficial for you to take aspirin daily.  Find healthy ways to manage stress, such as:  Meditation, yoga, or listening to music.  Journaling.  Talking to a trusted person.  Spending time with friends and family.  Minimize exposure to UV radiation to reduce your risk of skin cancer.  Safety  Always wear your seat belt while driving or riding in a vehicle.  Do not drive:  If you have been drinking alcohol. Do not ride with someone who has been drinking.  When you are tired or distracted.  While texting.  If you have been using any mind-altering substances or drugs.  Wear a helmet and other protective equipment during sports activities.  If you have firearms in your house, make sure you follow all gun safety procedures.  Seek help if you have been physically or sexually abused.  What's next?  Visit your health care provider once a year for an annual wellness visit.  Ask your health care provider how often you should have your eyes and teeth checked.  Stay up to date on all vaccines.  This information is not intended to replace advice given to you by your health care provider. Make sure you discuss any questions you have with your health care provider.  Document Revised: 02/16/2021 Document Reviewed: 02/16/2021  Elsevier Patient Education  2024 [employer].

## 2024-09-02 NOTE — Progress Notes (Signed)
 "  GLORIAN MCDONELL 02-22-1966 [id number]   History: Postmenopausal 58 y.o. presents for annual exam. C/o low libido, interested in testosterone supplementation. Doing well on HRT, bleeding had stopped, recently had very scant bleeding.    Gynecologic History Postmenopausal Last Pap: 8/24. Results were: normal Last mammogram: 11/25. Results were: normal Last colonoscopy: 2021 DEXA:10/05/23 normal HRT use: current  Obstetric History OB History  Gravida Para Term Preterm AB Living  2 1 1  1 2   SAB IAB Ectopic Multiple Live Births  1   1     # Outcome Date GA Lbr Len/2nd Weight Sex Type Anes PTL Lv  2 SAB           1A Term           1B Term                09/02/2024    3:34 PM 08/30/2023    2:48 PM  Depression screen PHQ 2/9  Decreased Interest 0 0  Down, Depressed, Hopeless 1 1  PHQ - 2 Score 1 1  Altered sleeping  0  Tired, decreased energy  1  Change in appetite  0  Feeling bad or failure about yourself   0  Trouble concentrating  0  Moving slowly or fidgety/restless  0  Suicidal thoughts  0  PHQ-9 Score  2   Difficult doing work/chores  Somewhat difficult     Data saved with a previous flowsheet row definition     The following portions of the patient's history were reviewed and updated as appropriate: allergies, current medications, past family history, past medical history, past social history, past surgical history, and problem list.  Review of Systems Pertinent items noted in HPI and remainder of comprehensive ROS otherwise negative.  Past medical history, past surgical history, family history and social history were all reviewed and documented in the EPIC chart.  Exam:  Vitals:   09/02/24 1533  BP: (!) 116/58  Pulse: 89  SpO2: 97%  Weight: 113 lb (51.3 kg)  Height: 5' 1 (1.549 m)   Body mass index is 21.35 kg/m.  General appearance:  Normal Thyroid:  Symmetrical, normal in size, without palpable masses or  nodularity. Respiratory  Auscultation:  Clear without wheezing or rhonchi Cardiovascular  Auscultation:  Regular rate, without rubs, murmurs or gallops  Edema/varicosities:  Not grossly evident Abdominal  Soft,nontender, without masses, guarding or rebound.  Liver/spleen:  No organomegaly noted  Hernia:  None appreciated  Skin  Inspection:  Grossly normal Breasts: Examined lying and sitting.   Right: Without masses, retractions, nipple discharge or axillary adenopathy.   Left: Without masses, retractions, nipple discharge or axillary adenopathy. Genitourinary   Inguinal/mons:  Normal without inguinal adenopathy  External genitalia:  Normal appearing vulva with no masses, tenderness, or lesions  BUS/Urethra/Skene's glands:  Normal  Vagina:  Normal appearing with normal color and discharge, no lesions. Atrophy: mild   Cervix:  Normal appearing without discharge or lesions  Uterus:  Normal in size, shape and contour.  Midline and mobile, nontender  Adnexa/parametria:     Rt: Normal in size, without masses or tenderness.   Lt: Normal in size, without masses or tenderness.  Anus and perineum: Normal    Darice Hoit, CMA present for exam  Assessment/Plan:   1. Well woman exam with routine gynecological exam (Primary) Pap 2027 Mammo yearly Establish PCP for labs  2. Hormone replacement therapy (HRT) - estradiol  (VIVELLE -DOT) 0.075 MG/24HR; Place 1 patch onto  the skin 2 (two) times a week.  Dispense: 24 patch; Refill: 4 - progesterone  (PROMETRIUM ) 100 MG capsule; Take 2 capsules (200 mg total) by mouth at bedtime.  Dispense: 180 capsule; Refill: 4  3. Genitourinary syndrome of menopause - Estradiol  (IMVEXXY  MAINTENANCE PACK) 10 MCG INST; Place 1 tablet vaginally 2 (two) times a week.  Dispense: 8 each; Refill: 11  4. Low libido - Testos,Total,Free and SHBG (Female)  5. Depression screen    Return in 1 year for annual or sooner prn.  Janet Decesare B WHNP-BC, 3:46 PM  09/02/2024 "

## 2024-09-06 ENCOUNTER — Other Ambulatory Visit: Payer: Self-pay | Admitting: Radiology

## 2024-09-06 DIAGNOSIS — Z79890 Hormone replacement therapy (postmenopausal): Secondary | ICD-10-CM

## 2024-09-06 LAB — TESTOS,TOTAL,FREE AND SHBG (FEMALE)
Free Testosterone: 0.4 pg/mL (ref 0.1–6.4)
Sex Hormone Binding: 69 nmol/L (ref 14–73)
Testosterone, Total, LC-MS-MS: 5 ng/dL (ref 2–45)

## 2024-09-08 NOTE — Telephone Encounter (Signed)
 Patient left message on triage line, stating she was returning call, no information provided. Patient request call back with detailed message.   Call returned to patient. Left message advising Rx refill request received for estradiol  0.075 mg patch for [facility] Rx. Advised this Rx was sent on 09/02/24 to start 09/04/24. 3 mo supply with 4 refills. Return call to office to provide update.

## 2024-09-08 NOTE — Telephone Encounter (Signed)
 Med refill request: Estradiol  (VIVELLE -DOT) 0.075 MG/24HR  Start:  09/04/24 Disp:  24 patch Refills:  4  Last AEX:  09/02/24 Next AEX:  Not yet scheduled Last MMG (if hormonal med):  N/A Refill authorized? Please Advise.

## 2024-09-09 ENCOUNTER — Ambulatory Visit: Payer: Self-pay | Admitting: Radiology

## 2024-09-10 ENCOUNTER — Other Ambulatory Visit: Payer: Self-pay | Admitting: Radiology

## 2024-09-10 DIAGNOSIS — R6882 Decreased libido: Secondary | ICD-10-CM

## 2024-09-10 DIAGNOSIS — Z79890 Hormone replacement therapy (postmenopausal): Secondary | ICD-10-CM

## 2024-09-10 MED ORDER — TESTOSTERONE 20.25 MG/ACT (1.62%) TD GEL: 1.0000 | Freq: Every evening | TRANSDERMAL | 0 refills | Status: AC

## 2024-09-10 NOTE — Telephone Encounter (Signed)
 Med refill request: progesterone  100 mg Last AEX: 09/02/24 Next AEX: none scheduled Last MMG (if hormonal med) 07/30/24 BI-RADS 1 benign Last refill: 07/18/2024 Last ordered 09/02/24 Refill denied.  Duplicate request.

## 2024-09-17 ENCOUNTER — Other Ambulatory Visit: Payer: Self-pay | Admitting: Radiology

## 2024-09-17 DIAGNOSIS — Z79890 Hormone replacement therapy (postmenopausal): Secondary | ICD-10-CM

## 2024-09-17 NOTE — Telephone Encounter (Signed)
 Med refill request:   estradiol  (VIVELLE -DOT) 0.075 MG/24HR  Start:  09/11/24 Disp:  24 patch Refills:  4  Last AEX:  09/02/24 Next AEX:  Not yet scheduled Last MMG (if hormonal med): 07/30/24 Refill authorized? Please Advise.
